# Patient Record
Sex: Female | Born: 1990 | Race: White | Hispanic: No | Marital: Married | Smoking: Former smoker
Health system: Southern US, Community
[De-identification: ages and names within clinical notes are randomized; demographics above are authoritative.]

## PROBLEM LIST (undated history)

## (undated) DIAGNOSIS — F99 Mental disorder, not otherwise specified: Secondary | ICD-10-CM

## (undated) DIAGNOSIS — X789XXA Intentional self-harm by unspecified sharp object, initial encounter: Secondary | ICD-10-CM

## (undated) DIAGNOSIS — T8859XA Other complications of anesthesia, initial encounter: Secondary | ICD-10-CM

## (undated) DIAGNOSIS — T4145XA Adverse effect of unspecified anesthetic, initial encounter: Secondary | ICD-10-CM

## (undated) HISTORY — PX: DILATION AND CURETTAGE OF UTERUS: SHX78

## (undated) HISTORY — PX: ANKLE SURGERY: SHX546

## (undated) HISTORY — PX: WRIST ARTHROSCOPY: SUR100

## (undated) HISTORY — DX: Other complications of anesthesia, initial encounter: T88.59XA

## (undated) HISTORY — DX: Adverse effect of unspecified anesthetic, initial encounter: T41.45XA

## (undated) HISTORY — PX: TONSILLECTOMY: SUR1361

---

## 2011-01-08 ENCOUNTER — Emergency Department (HOSPITAL_COMMUNITY)
Admission: EM | Admit: 2011-01-08 | Discharge: 2011-01-08 | Discharge status: Against Medical Advice | Payer: Self-pay | Attending: Emergency Medicine | Admitting: Emergency Medicine

## 2011-01-08 DIAGNOSIS — Z0389 Encounter for observation for other suspected diseases and conditions ruled out: Secondary | ICD-10-CM | POA: Insufficient documentation

## 2011-04-06 ENCOUNTER — Inpatient Hospital Stay (INDEPENDENT_AMBULATORY_CARE_PROVIDER_SITE_OTHER)
Admission: RE | Admit: 2011-04-06 | Discharge: 2011-04-06 | Disposition: A | Discharge status: Home / Self Care | Payer: Self-pay | Source: Ambulatory Visit | Attending: Emergency Medicine | Admitting: Emergency Medicine

## 2011-04-06 DIAGNOSIS — S335XXA Sprain of ligaments of lumbar spine, initial encounter: Secondary | ICD-10-CM

## 2011-04-18 ENCOUNTER — Emergency Department (HOSPITAL_COMMUNITY)
Admission: EM | Admit: 2011-04-18 | Discharge: 2011-04-19 | Disposition: A | Discharge status: Home / Self Care | Payer: Self-pay | Attending: Emergency Medicine | Admitting: Emergency Medicine

## 2011-04-18 DIAGNOSIS — M79609 Pain in unspecified limb: Secondary | ICD-10-CM | POA: Insufficient documentation

## 2011-04-18 DIAGNOSIS — R0789 Other chest pain: Secondary | ICD-10-CM | POA: Insufficient documentation

## 2011-04-18 DIAGNOSIS — M542 Cervicalgia: Secondary | ICD-10-CM | POA: Insufficient documentation

## 2011-04-18 DIAGNOSIS — J45909 Unspecified asthma, uncomplicated: Secondary | ICD-10-CM | POA: Insufficient documentation

## 2011-04-18 DIAGNOSIS — R110 Nausea: Secondary | ICD-10-CM | POA: Insufficient documentation

## 2011-04-18 DIAGNOSIS — R0602 Shortness of breath: Secondary | ICD-10-CM | POA: Insufficient documentation

## 2011-04-19 ENCOUNTER — Emergency Department (HOSPITAL_COMMUNITY): Payer: Self-pay

## 2011-04-19 LAB — DIFFERENTIAL
Basophils Absolute: 0 10*3/uL (ref 0.0–0.1)
Basophils Relative: 1 % (ref 0–1)
Eosinophils Absolute: 0.2 10*3/uL (ref 0.0–0.7)
Eosinophils Relative: 3 % (ref 0–5)
Monocytes Absolute: 0.5 10*3/uL (ref 0.1–1.0)
Monocytes Relative: 7 % (ref 3–12)
Neutro Abs: 3.3 10*3/uL (ref 1.7–7.7)

## 2011-04-19 LAB — URINALYSIS, ROUTINE W REFLEX MICROSCOPIC
Glucose, UA: NEGATIVE mg/dL
Ketones, ur: NEGATIVE mg/dL
Leukocytes, UA: NEGATIVE
Protein, ur: NEGATIVE mg/dL
Urobilinogen, UA: 1 mg/dL (ref 0.0–1.0)

## 2011-04-19 LAB — POCT I-STAT, CHEM 8
HCT: 42 % (ref 36.0–46.0)
Hemoglobin: 14.3 g/dL (ref 12.0–15.0)
Potassium: 3.8 mEq/L (ref 3.5–5.1)
Sodium: 143 mEq/L (ref 135–145)
TCO2: 24 mmol/L (ref 0–100)

## 2011-04-19 LAB — CBC
MCH: 30.2 pg (ref 26.0–34.0)
MCHC: 34.4 g/dL (ref 30.0–36.0)
Platelets: 265 10*3/uL (ref 150–400)
RDW: 13.2 % (ref 11.5–15.5)

## 2011-07-25 ENCOUNTER — Encounter: Payer: Self-pay | Admitting: *Deleted

## 2011-07-25 ENCOUNTER — Emergency Department (HOSPITAL_COMMUNITY): Payer: Self-pay

## 2011-07-25 ENCOUNTER — Emergency Department (HOSPITAL_COMMUNITY)
Admission: EM | Admit: 2011-07-25 | Discharge: 2011-07-25 | Disposition: A | Discharge status: Home / Self Care | Payer: Self-pay | Attending: Emergency Medicine | Admitting: Emergency Medicine

## 2011-07-25 DIAGNOSIS — B9789 Other viral agents as the cause of diseases classified elsewhere: Secondary | ICD-10-CM | POA: Insufficient documentation

## 2011-07-25 DIAGNOSIS — J3489 Other specified disorders of nose and nasal sinuses: Secondary | ICD-10-CM | POA: Insufficient documentation

## 2011-07-25 DIAGNOSIS — J45909 Unspecified asthma, uncomplicated: Secondary | ICD-10-CM | POA: Insufficient documentation

## 2011-07-25 DIAGNOSIS — H9209 Otalgia, unspecified ear: Secondary | ICD-10-CM | POA: Insufficient documentation

## 2011-07-25 DIAGNOSIS — R059 Cough, unspecified: Secondary | ICD-10-CM | POA: Insufficient documentation

## 2011-07-25 DIAGNOSIS — B349 Viral infection, unspecified: Secondary | ICD-10-CM

## 2011-07-25 DIAGNOSIS — R231 Pallor: Secondary | ICD-10-CM | POA: Insufficient documentation

## 2011-07-25 DIAGNOSIS — R509 Fever, unspecified: Secondary | ICD-10-CM | POA: Insufficient documentation

## 2011-07-25 DIAGNOSIS — R05 Cough: Secondary | ICD-10-CM | POA: Insufficient documentation

## 2011-07-25 MED ORDER — PHENYLEPH-PROMETHAZINE-COD 5-6.25-10 MG/5ML PO SYRP: 5.0000 mL | ORAL_SOLUTION | Freq: Four times a day (QID) | ORAL | Status: DC

## 2011-07-25 MED ORDER — PROMETHAZINE-CODEINE 6.25-10 MG/5ML PO SYRP
5.0000 mL | ORAL_SOLUTION | Freq: Once | ORAL | Status: AC
  Administered 2011-07-25: 5 mL via ORAL
  Filled 2011-07-25: qty 5

## 2011-07-25 MED ORDER — PROMETHAZINE-DM 6.25-15 MG/5ML PO SYRP: 5.0000 mL | ORAL_SOLUTION | Freq: Four times a day (QID) | ORAL | Status: AC | PRN

## 2011-07-25 NOTE — ED Provider Notes (Signed)
History     CSN: [id number] Arrival date & time: 07/25/2011 12:47 AM   First MD Initiated Contact with Patient 07/25/11 0345      Chief Complaint  Patient presents with  . Cough    (Consider location/radiation/quality/duration/timing/severity/associated sxs/prior treatment) Patient is a 20 y.o. female presenting with cough. The history is provided by the patient.  Cough The current episode started more than 2 days ago. The problem occurs constantly. The problem has been gradually worsening. The cough is non-productive. The maximum temperature recorded prior to her arrival was 100 to 100.9 F. Associated symptoms include chills, ear congestion, ear pain and rhinorrhea. Pertinent negatives include no headaches. She has tried cough syrup for the symptoms. Her past medical history is significant for asthma.    Past Medical History  Diagnosis Date  . Asthma     History reviewed. No pertinent past surgical history.  History reviewed. No pertinent family history.  History  Substance Use Topics  . Smoking status: Not on file  . Smokeless tobacco: Not on file  . Alcohol Use: No    OB History    Grav Para Term Preterm Abortions TAB SAB Ect Mult Living                  Review of Systems  Constitutional: Positive for chills.  HENT: Positive for ear pain and rhinorrhea. Negative for ear discharge.   Respiratory: Positive for cough.   Cardiovascular: Negative.   Gastrointestinal: Negative.   Skin: Positive for pallor.  Neurological: Negative for dizziness and headaches.  Hematological: Negative.   Psychiatric/Behavioral: Negative.     Allergies  Review of patient's allergies indicates no known allergies.  Home Medications   Current Outpatient Rx  Name Route Sig Dispense Refill  . AZITHROMYCIN 250 MG PO TABS Oral Take 250 mg by mouth daily. z-pak for 5 days     . HYDROCODONE-HOMATROPINE 5-1.5 MG/5ML PO SYRP Oral Take 5 mLs by mouth every 6 (six) hours as needed. cough         BP 109/73  Pulse 83  Temp(Src) 98.2 F (36.8 C) (Oral)  SpO2 97%  Physical Exam  Constitutional: She is oriented to person, place, and time. She appears well-developed and well-nourished.  HENT:  Head: Normocephalic.  Eyes: EOM are normal.  Neck: Normal range of motion.  Cardiovascular: Normal rate.   Pulmonary/Chest: No respiratory distress. She has no wheezes. She has no rales. She exhibits no tenderness.  Abdominal: Soft.  Musculoskeletal: Normal range of motion.  Neurological: She is oriented to person, place, and time.  Skin: Skin is warm and dry.  Psychiatric: She has a normal mood and affect.    ED Course  Procedures (including critical care time)  Labs Reviewed - No data to display No results found.   No diagnosis found.    MDM  Currently being treated for URI not getting better taking Z pack and Hycodan without relief  Will xray chest and change cough syrup to Phenergan with codiene        Arman Filter, NP 07/25/11 0406  Arman Filter, NP 07/25/11 0408

## 2011-07-25 NOTE — ED Notes (Signed)
Assumed care of pt.  No distress noted.  Pt is noted to be coughing.

## 2011-07-25 NOTE — ED Notes (Signed)
The pt has been coughing since Friday.  She was seen x1 at the [facility] and she was given some cough medicines.  Her lt ear feels stuffed

## 2011-07-25 NOTE — ED Provider Notes (Signed)
Medical screening examination/treatment/procedure(s) were performed by non-physician practitioner and as supervising physician I was immediately available for consultation/collaboration.  Hanley Seamen, MD 07/25/11 [phone]

## 2011-07-25 NOTE — ED Notes (Signed)
Report given to RN Katie.

## 2011-08-06 ENCOUNTER — Emergency Department (HOSPITAL_COMMUNITY)
Admission: EM | Admit: 2011-08-06 | Discharge: 2011-08-06 | Disposition: A | Discharge status: Home / Self Care | Payer: Self-pay | Attending: Emergency Medicine | Admitting: Emergency Medicine

## 2011-08-06 ENCOUNTER — Encounter (HOSPITAL_COMMUNITY): Payer: Self-pay

## 2011-08-06 ENCOUNTER — Emergency Department (HOSPITAL_COMMUNITY): Payer: Self-pay

## 2011-08-06 DIAGNOSIS — R059 Cough, unspecified: Secondary | ICD-10-CM | POA: Insufficient documentation

## 2011-08-06 DIAGNOSIS — J029 Acute pharyngitis, unspecified: Secondary | ICD-10-CM | POA: Insufficient documentation

## 2011-08-06 DIAGNOSIS — R05 Cough: Secondary | ICD-10-CM

## 2011-08-06 DIAGNOSIS — R599 Enlarged lymph nodes, unspecified: Secondary | ICD-10-CM | POA: Insufficient documentation

## 2011-08-06 DIAGNOSIS — R0789 Other chest pain: Secondary | ICD-10-CM | POA: Insufficient documentation

## 2011-08-06 DIAGNOSIS — M549 Dorsalgia, unspecified: Secondary | ICD-10-CM | POA: Insufficient documentation

## 2011-08-06 DIAGNOSIS — B9789 Other viral agents as the cause of diseases classified elsewhere: Secondary | ICD-10-CM

## 2011-08-06 MED ORDER — HYDROCODONE-HOMATROPINE 5-1.5 MG/5ML PO SYRP: 5.0000 mL | ORAL_SOLUTION | Freq: Four times a day (QID) | ORAL | Status: AC | PRN

## 2011-08-06 MED ORDER — DIAZEPAM 5 MG PO TABS: 5.0000 mg | ORAL_TABLET | Freq: Two times a day (BID) | ORAL | Status: AC

## 2011-08-06 NOTE — ED Provider Notes (Signed)
Medical screening examination/treatment/procedure(s) were performed by non-physician practitioner and as supervising physician I was immediately available for consultation/collaboration.   Andrew King, MD 08/06/11 1251 

## 2011-08-06 NOTE — ED Provider Notes (Signed)
History     CSN: [id number] Arrival date & time: 08/06/2011  9:37 AM   First MD Initiated Contact with Patient 08/06/11 1000      Chief Complaint  Patient presents with  . Back Pain    pt in with back and rib pain with sore throat states onset 3 weeks ago with no relief pt states pain when moving states no relief with otc meds  . Sore Throat    (Consider location/radiation/quality/duration/timing/severity/associated sxs/prior treatment) HPI Comments: Patient presents with cough sore throat and rib pain that has been occurring for the last 12 days. She came into the ED on a 10 and was discharged without any antibiotics. She is concerned because her symptoms have not improved and her ribs are hurting from the amount of coughing she's been doing. She has been afebrile and denies rhinorrhea, headache, change in vision, sinus pressure, but other complaints.  Patient is a 20 y.o. female presenting with back pain and pharyngitis. The history is provided by the patient.  Back Pain  Pertinent negatives include no chest pain, no fever, no numbness, no headaches, no abdominal pain, no dysuria and no weakness.  Sore Throat Associated symptoms include coughing. Pertinent negatives include no abdominal pain, chest pain, chills, congestion, fatigue, fever, headaches, myalgias, nausea, numbness, rash, sore throat, vomiting or weakness.    Past Medical History  Diagnosis Date  . Asthma     History reviewed. No pertinent past surgical history.  No family history on file.  History  Substance Use Topics  . Smoking status: Not on file  . Smokeless tobacco: Not on file  . Alcohol Use: No    OB History    Grav Para Term Preterm Abortions TAB SAB Ect Mult Living                  Review of Systems  Constitutional: Negative for fever, chills, appetite change and fatigue.  HENT: Negative for hearing loss, ear pain, nosebleeds, congestion, sore throat, rhinorrhea, sneezing, trouble swallowing,  neck stiffness, voice change, postnasal drip, sinus pressure, tinnitus and ear discharge.   Eyes: Negative for photophobia and visual disturbance.  Respiratory: Positive for cough and chest tightness. Negative for apnea, choking, shortness of breath, wheezing and stridor.   Cardiovascular: Negative for chest pain, palpitations and leg swelling.  Gastrointestinal: Negative for nausea, vomiting, abdominal pain, diarrhea and constipation.  Genitourinary: Negative for dysuria, urgency and flank pain.  Musculoskeletal: Positive for back pain (lower right-sided rib pain). Negative for myalgias.  Skin: Negative for rash.  Neurological: Negative for dizziness, seizures, syncope, weakness, light-headedness, numbness and headaches.  Psychiatric/Behavioral: Negative for behavioral problems and confusion.  All other systems reviewed and are negative.    Allergies  Review of patient's allergies indicates no known allergies.  Home Medications   Current Outpatient Rx  Name Route Sig Dispense Refill  . IBUPROFEN 200 MG PO TABS Oral Take 200 mg by mouth every 6 (six) hours as needed. pain     . HALLS COUGH DROPS MT Mouth/Throat Use as directed 1 lozenge in the mouth or throat as needed. For cough       BP 115/66  Pulse 95  Temp(Src) 98.3 F (36.8 C) (Oral)  Resp 16  SpO2 98%  LMP 07/20/2011  Physical Exam  Nursing note and vitals reviewed. Constitutional: She is oriented to person, place, and time. She appears well-developed and well-nourished. No distress.  HENT:  Head: Normocephalic and atraumatic. No trismus in the jaw.  Right  Ear: Tympanic membrane, external ear and ear canal normal.  Left Ear: Tympanic membrane, external ear and ear canal normal.  Nose: Nose normal.  Mouth/Throat: Uvula is midline and mucous membranes are normal. Oropharyngeal exudate present. No posterior oropharyngeal edema, posterior oropharyngeal erythema or tonsillar abscesses.  Eyes: EOM are normal. Pupils are  equal, round, and reactive to light.       Normal appearance  Neck: Normal range of motion. Neck supple.  Cardiovascular: Normal rate and regular rhythm.   Pulmonary/Chest: Effort normal and breath sounds normal. No respiratory distress. She has no wheezes. She has no rales.   She exhibits tenderness.  Lymphadenopathy:    She has cervical adenopathy.  Neurological: She is alert and oriented to person, place, and time.  Skin: Skin is warm and dry. No rash noted.  Psychiatric: She has a normal mood and affect. Her behavior is normal.    ED Course  Procedures (including critical care time)  Labs Reviewed - No data to display No results found.   No diagnosis found.    MDM  Cough  Sore throat  Chest wall tenderness        [place], [place] 08/06/11 1112

## 2013-01-22 IMAGING — CR DG CHEST 2V
2 series · 2 of 2 positions shown · non-contrast
Comparison: 04/19/2011

CLINICAL DATA: Cough, fever.

CHEST - 2 VIEW

[w chest pa]
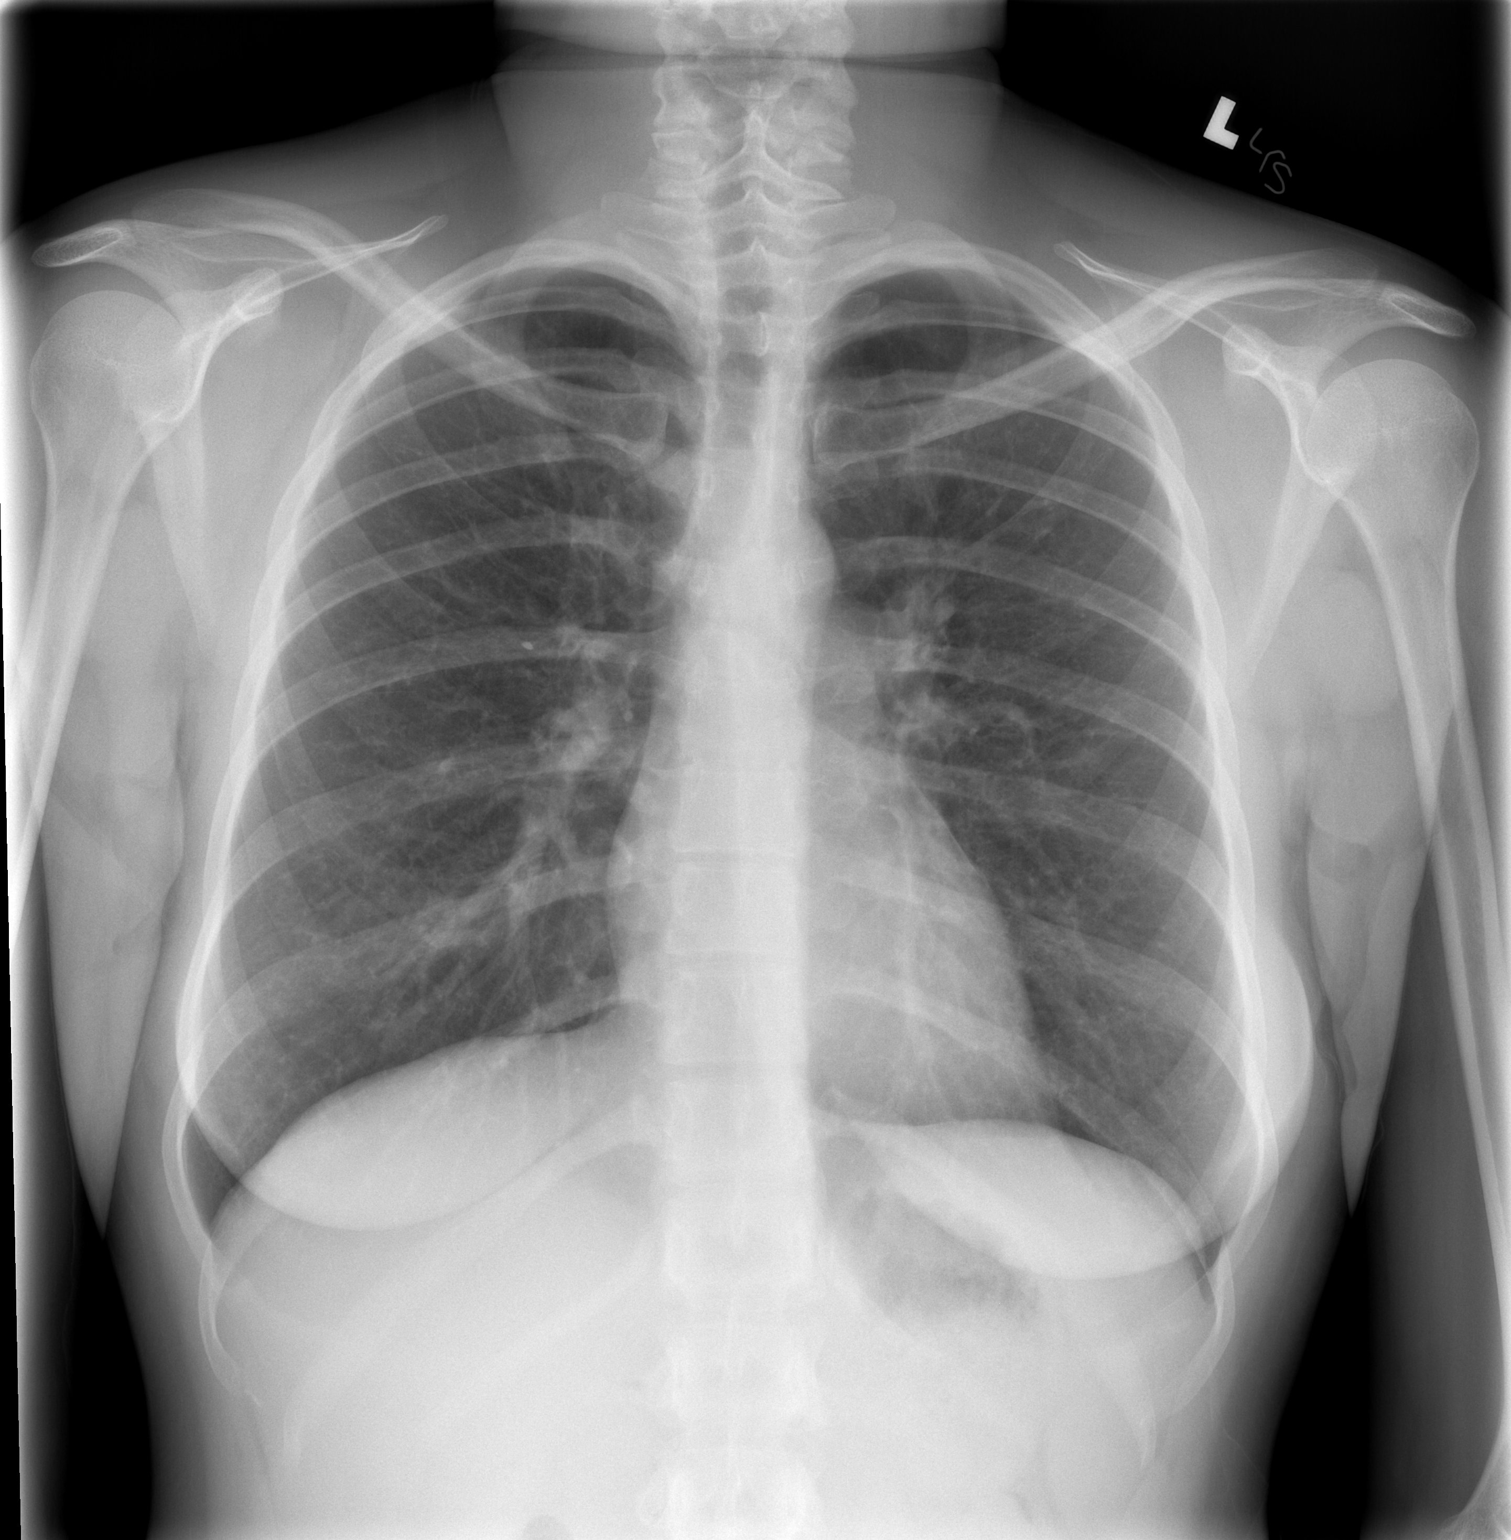

[w chest lat]
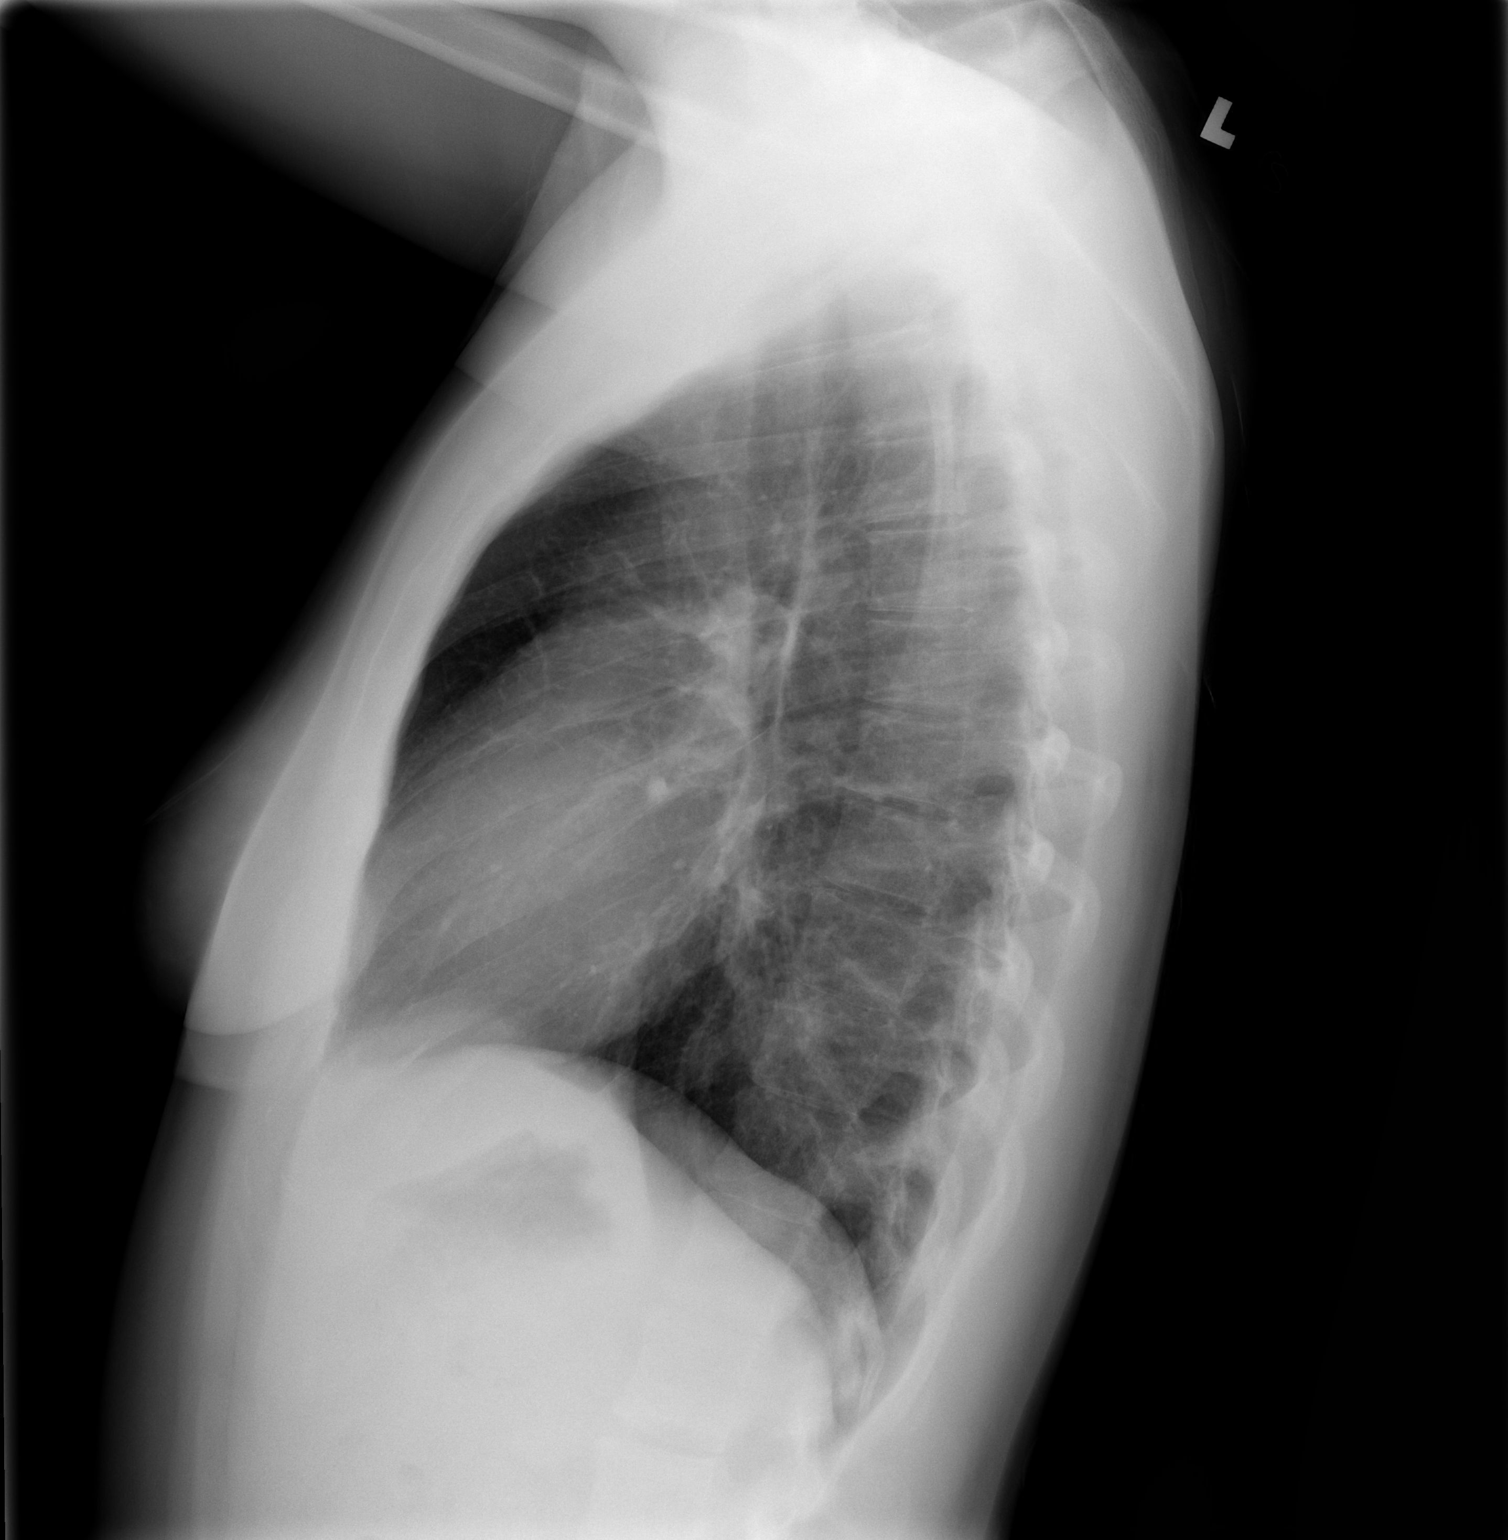

[2 of 2 positions shown; findings below may reference images not displayed]

FINDINGS: Lungs are clear. No pleural effusion or pneumothorax. The
cardiomediastinal contours are within normal limits. The visualized
bones and soft tissues are without significant appreciable
abnormality.
IMPRESSION: No acute cardiopulmonary process identified.

## 2013-02-03 IMAGING — CR DG CHEST 2V
2 series · 2 of 2 positions shown · non-contrast
Comparison: 07/25/2011

CLINICAL DATA: Cough, right sided pain

CHEST - 2 VIEW

[w chest pa]
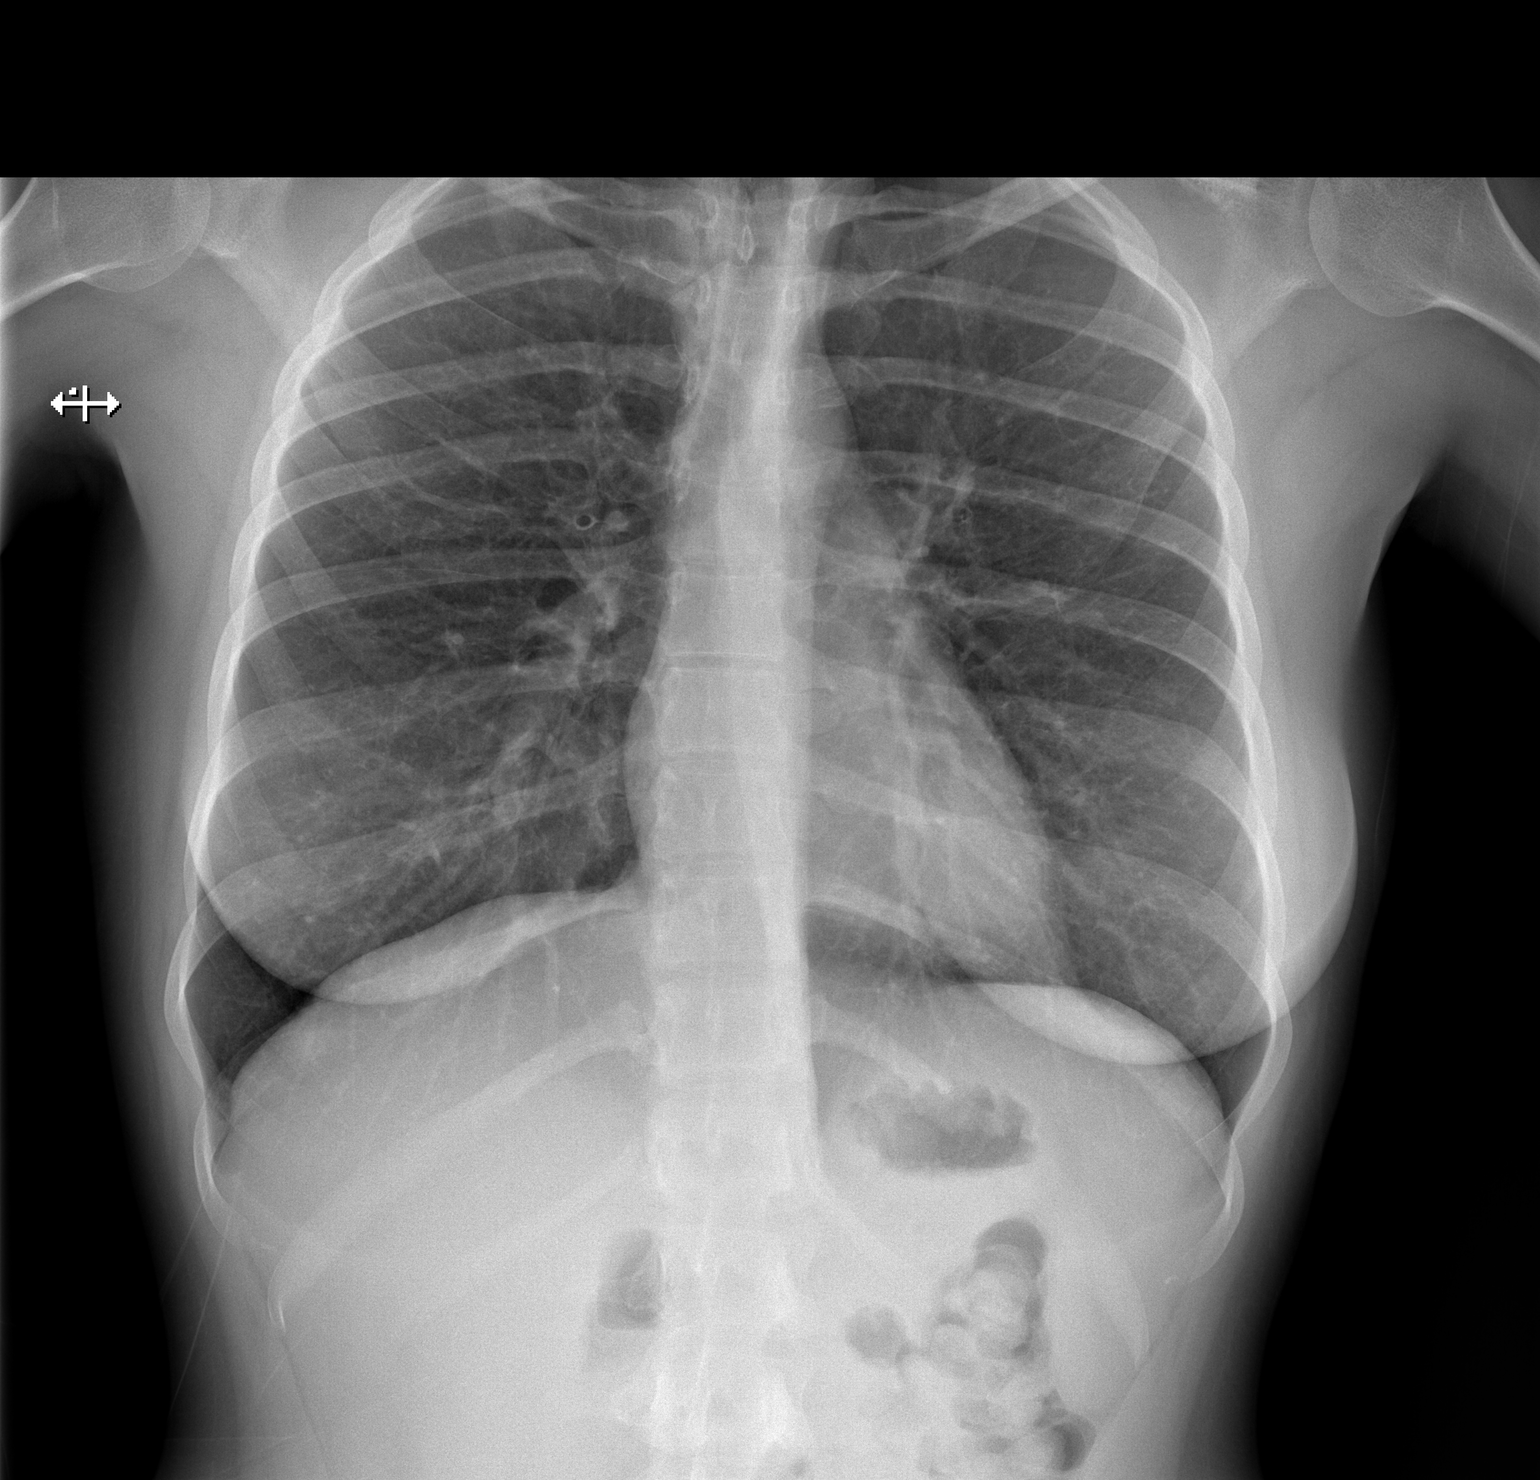

[w chest lat]
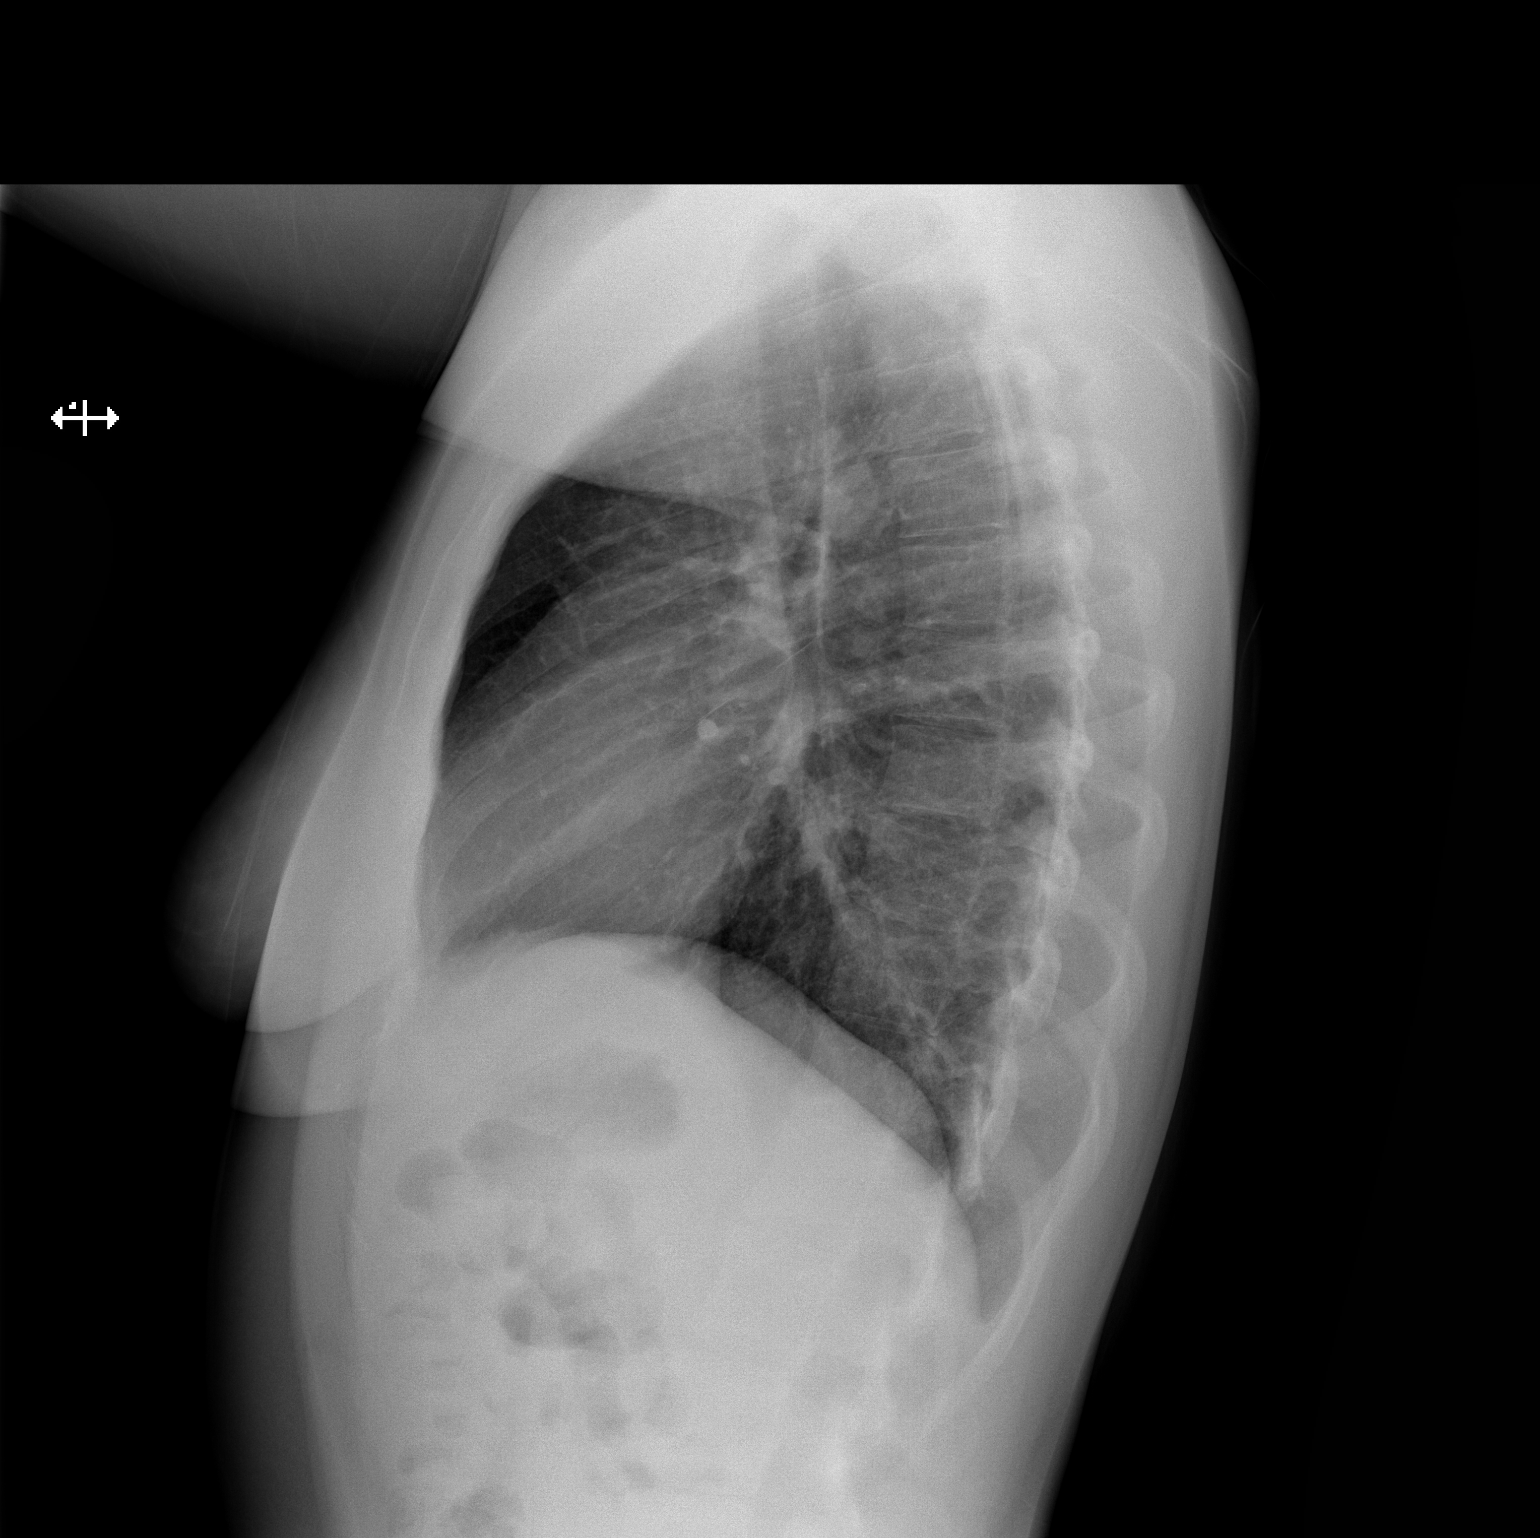

[2 of 2 positions shown; findings below may reference images not displayed]

FINDINGS: No pneumothorax. Lungs clear.  Heart size and pulmonary
vascularity normal.  No effusion.  Visualized bones unremarkable.
IMPRESSION: No acute disease

## 2014-12-13 ENCOUNTER — Encounter (HOSPITAL_COMMUNITY)
Admission: EM | Disposition: A | Discharge status: Other Acute Inpatient Hospital | Payer: Self-pay | Source: Home / Self Care | Attending: Emergency Medicine

## 2014-12-13 ENCOUNTER — Inpatient Hospital Stay (HOSPITAL_COMMUNITY)
Admission: AD | Admit: 2014-12-13 | Discharge: 2014-12-17 | DRG: 885 | Disposition: A | Discharge status: Home / Self Care | Payer: Federal, State, Local not specified - Other | Source: Intra-hospital | Attending: Psychiatry | Admitting: Psychiatry

## 2014-12-13 ENCOUNTER — Ambulatory Visit (HOSPITAL_COMMUNITY)
Admission: EM | Admit: 2014-12-13 | Discharge: 2014-12-13 | Payer: Self-pay | Attending: Emergency Medicine | Admitting: Emergency Medicine

## 2014-12-13 ENCOUNTER — Emergency Department (HOSPITAL_COMMUNITY): Payer: Self-pay | Admitting: Anesthesiology

## 2014-12-13 ENCOUNTER — Encounter (HOSPITAL_COMMUNITY): Payer: Self-pay | Admitting: Behavioral Health

## 2014-12-13 ENCOUNTER — Encounter (HOSPITAL_COMMUNITY): Payer: Self-pay | Admitting: *Deleted

## 2014-12-13 DIAGNOSIS — Z91012 Allergy to eggs: Secondary | ICD-10-CM | POA: Insufficient documentation

## 2014-12-13 DIAGNOSIS — S61512A Laceration without foreign body of left wrist, initial encounter: Secondary | ICD-10-CM

## 2014-12-13 DIAGNOSIS — F17210 Nicotine dependence, cigarettes, uncomplicated: Secondary | ICD-10-CM | POA: Insufficient documentation

## 2014-12-13 DIAGNOSIS — T1491XA Suicide attempt, initial encounter: Secondary | ICD-10-CM

## 2014-12-13 DIAGNOSIS — Z885 Allergy status to narcotic agent status: Secondary | ICD-10-CM | POA: Insufficient documentation

## 2014-12-13 DIAGNOSIS — F321 Major depressive disorder, single episode, moderate: Secondary | ICD-10-CM | POA: Diagnosis not present

## 2014-12-13 DIAGNOSIS — R45851 Suicidal ideations: Secondary | ICD-10-CM | POA: Diagnosis present

## 2014-12-13 DIAGNOSIS — X789XXA Intentional self-harm by unspecified sharp object, initial encounter: Secondary | ICD-10-CM | POA: Diagnosis present

## 2014-12-13 DIAGNOSIS — S66822A Laceration of other specified muscles, fascia and tendons at wrist and hand level, left hand, initial encounter: Secondary | ICD-10-CM | POA: Insufficient documentation

## 2014-12-13 DIAGNOSIS — J45909 Unspecified asthma, uncomplicated: Secondary | ICD-10-CM | POA: Insufficient documentation

## 2014-12-13 DIAGNOSIS — T1491 Suicide attempt: Secondary | ICD-10-CM | POA: Diagnosis not present

## 2014-12-13 DIAGNOSIS — F329 Major depressive disorder, single episode, unspecified: Secondary | ICD-10-CM | POA: Diagnosis not present

## 2014-12-13 DIAGNOSIS — Y929 Unspecified place or not applicable: Secondary | ICD-10-CM | POA: Insufficient documentation

## 2014-12-13 DIAGNOSIS — X781XXA Intentional self-harm by knife, initial encounter: Secondary | ICD-10-CM | POA: Insufficient documentation

## 2014-12-13 HISTORY — DX: Intentional self-harm by unspecified sharp object, initial encounter: X78.9XXA

## 2014-12-13 HISTORY — PX: INCISION AND DRAINAGE OF WOUND: SHX1803

## 2014-12-13 LAB — ETHANOL

## 2014-12-13 LAB — COMPREHENSIVE METABOLIC PANEL
ALBUMIN: 4.7 g/dL (ref 3.5–5.2)
ALK PHOS: 49 U/L (ref 39–117)
ALT: 14 U/L (ref 0–35)
ANION GAP: 12 (ref 5–15)
AST: 20 U/L (ref 0–37)
BUN: 9 mg/dL (ref 6–23)
CO2: 23 mmol/L (ref 19–32)
CREATININE: 0.77 mg/dL (ref 0.50–1.10)
Calcium: 9.2 mg/dL (ref 8.4–10.5)
Chloride: 106 mmol/L (ref 96–112)
GFR calc Af Amer: >90 mL/min (ref >90)
GFR calc non Af Amer: >90 mL/min (ref >90)
GLUCOSE: 113 mg/dL — AB (ref 70–99)
Potassium: 3 mmol/L — ABNORMAL LOW (ref 3.5–5.1)
SODIUM: 141 mmol/L (ref 135–145)
Total Bilirubin: 0.6 mg/dL (ref 0.3–1.2)
Total Protein: 7.5 g/dL (ref 6.0–8.3)

## 2014-12-13 LAB — RAPID URINE DRUG SCREEN, HOSP PERFORMED
AMPHETAMINES: NOT DETECTED
Barbiturates: NOT DETECTED
Benzodiazepines: NOT DETECTED
COCAINE: NOT DETECTED
OPIATES: POSITIVE — AB
TETRAHYDROCANNABINOL: POSITIVE — AB

## 2014-12-13 LAB — CBC
HEMATOCRIT: 44.9 % (ref 36.0–46.0)
Hemoglobin: 15.1 g/dL — ABNORMAL HIGH (ref 12.0–15.0)
MCH: 31.3 pg (ref 26.0–34.0)
MCHC: 33.6 g/dL (ref 30.0–36.0)
MCV: 93 fL (ref 78.0–100.0)
Platelets: 223 10*3/uL (ref 150–400)
RBC: 4.83 MIL/uL (ref 3.87–5.11)
RDW: 14 % (ref 11.5–15.5)
WBC: 6.8 10*3/uL (ref 4.0–10.5)

## 2014-12-13 LAB — SALICYLATE LEVEL

## 2014-12-13 LAB — POC URINE PREG, ED: PREG TEST UR: NEGATIVE

## 2014-12-13 LAB — ACETAMINOPHEN LEVEL: Acetaminophen (Tylenol), Serum: <10 ug/mL — ABNORMAL LOW (ref 10–30)

## 2014-12-13 SURGERY — IRRIGATION AND DEBRIDEMENT WOUND
Anesthesia: General | Site: Wrist | Laterality: Left

## 2014-12-13 MED ORDER — OXYCODONE-ACETAMINOPHEN 5-325 MG PO TABS
2.0000 | ORAL_TABLET | Freq: Once | ORAL | Status: AC
  Administered 2014-12-13: 2 via ORAL

## 2014-12-13 MED ORDER — ACETAMINOPHEN 325 MG PO TABS
650.0000 mg | ORAL_TABLET | Freq: Four times a day (QID) | ORAL | Status: DC | PRN
  Administered 2014-12-17: 650 mg via ORAL
  Filled 2014-12-13: qty 2
  Filled 2014-12-13: qty 1

## 2014-12-13 MED ORDER — FENTANYL CITRATE 0.05 MG/ML IJ SOLN
INTRAMUSCULAR | Status: DC | PRN
  Administered 2014-12-13 (×2): 100 ug via INTRAVENOUS
  Administered 2014-12-13: 50 ug via INTRAVENOUS

## 2014-12-13 MED ORDER — DIPHENHYDRAMINE HCL 50 MG/ML IJ SOLN
INTRAMUSCULAR | Status: AC
  Filled 2014-12-13: qty 1

## 2014-12-13 MED ORDER — NICOTINE 7 MG/24HR TD PT24
7.0000 mg | MEDICATED_PATCH | Freq: Every day | TRANSDERMAL | Status: DC
  Administered 2014-12-13: 7 mg via TRANSDERMAL
  Filled 2014-12-13: qty 1

## 2014-12-13 MED ORDER — ONDANSETRON HCL 4 MG/2ML IJ SOLN
INTRAMUSCULAR | Status: DC | PRN
  Administered 2014-12-13: 4 mg via INTRAVENOUS

## 2014-12-13 MED ORDER — MIDAZOLAM HCL 5 MG/5ML IJ SOLN
INTRAMUSCULAR | Status: DC | PRN
  Administered 2014-12-13: 2 mg via INTRAVENOUS

## 2014-12-13 MED ORDER — BUPIVACAINE HCL (PF) 0.5 % IJ SOLN
INTRAMUSCULAR | Status: DC | PRN
  Administered 2014-12-13: 10 mL

## 2014-12-13 MED ORDER — OXYCODONE-ACETAMINOPHEN 5-325 MG PO TABS
2.0000 | ORAL_TABLET | Freq: Four times a day (QID) | ORAL | Status: DC | PRN
  Administered 2014-12-14 – 2014-12-16 (×10): 2 via ORAL
  Filled 2014-12-13 (×10): qty 2

## 2014-12-13 MED ORDER — HYDROMORPHONE HCL 1 MG/ML IJ SOLN: 0.2500 mg | INTRAMUSCULAR | Status: DC | PRN

## 2014-12-13 MED ORDER — MIDAZOLAM HCL 2 MG/2ML IJ SOLN
INTRAMUSCULAR | Status: AC
  Filled 2014-12-13: qty 2

## 2014-12-13 MED ORDER — LIDOCAINE HCL (CARDIAC) 20 MG/ML IV SOLN
INTRAVENOUS | Status: DC | PRN
  Administered 2014-12-13: 100 mg via INTRAVENOUS

## 2014-12-13 MED ORDER — ONDANSETRON HCL 4 MG/2ML IJ SOLN
INTRAMUSCULAR | Status: AC
  Filled 2014-12-13: qty 2

## 2014-12-13 MED ORDER — HYDROXYZINE HCL 25 MG PO TABS: 25.0000 mg | ORAL_TABLET | Freq: Four times a day (QID) | ORAL | Status: DC | PRN

## 2014-12-13 MED ORDER — OXYCODONE-ACETAMINOPHEN 5-325 MG PO TABS
ORAL_TABLET | ORAL | Status: AC
  Administered 2014-12-13: 2 via ORAL
  Filled 2014-12-13: qty 2

## 2014-12-13 MED ORDER — MAGNESIUM HYDROXIDE 400 MG/5ML PO SUSP: 30.0000 mL | Freq: Every day | ORAL | Status: DC | PRN

## 2014-12-13 MED ORDER — PROPOFOL 10 MG/ML IV BOLUS
INTRAVENOUS | Status: DC | PRN
  Administered 2014-12-13: 50 mg via INTRAVENOUS
  Administered 2014-12-13: 150 mg via INTRAVENOUS

## 2014-12-13 MED ORDER — PROPOFOL 10 MG/ML IV BOLUS
INTRAVENOUS | Status: AC
  Filled 2014-12-13: qty 20

## 2014-12-13 MED ORDER — 0.9 % SODIUM CHLORIDE (POUR BTL) OPTIME
TOPICAL | Status: DC | PRN
  Administered 2014-12-13: 1000 mL

## 2014-12-13 MED ORDER — MEPERIDINE HCL 25 MG/ML IJ SOLN: 6.2500 mg | INTRAMUSCULAR | Status: DC | PRN

## 2014-12-13 MED ORDER — TETANUS-DIPHTH-ACELL PERTUSSIS 5-2.5-18.5 LF-MCG/0.5 IM SUSP
0.5000 mL | Freq: Once | INTRAMUSCULAR | Status: AC
  Administered 2014-12-13: 0.5 mL via INTRAMUSCULAR
  Filled 2014-12-13: qty 0.5

## 2014-12-13 MED ORDER — TRAZODONE HCL 50 MG PO TABS
50.0000 mg | ORAL_TABLET | Freq: Every evening | ORAL | Status: DC | PRN
  Filled 2014-12-13: qty 14

## 2014-12-13 MED ORDER — FENTANYL CITRATE 0.05 MG/ML IJ SOLN
INTRAMUSCULAR | Status: AC
  Filled 2014-12-13: qty 5

## 2014-12-13 MED ORDER — ALUM & MAG HYDROXIDE-SIMETH 200-200-20 MG/5ML PO SUSP: 30.0000 mL | ORAL | Status: DC | PRN

## 2014-12-13 MED ORDER — CEFAZOLIN SODIUM-DEXTROSE 2-3 GM-% IV SOLR
INTRAVENOUS | Status: DC | PRN
Start: 2014-12-13 — End: 2014-12-13
  Administered 2014-12-13: 2 g via INTRAVENOUS

## 2014-12-13 MED ORDER — LACTATED RINGERS IV SOLN
INTRAVENOUS | Status: DC | PRN
  Administered 2014-12-13: 15:00:00 via INTRAVENOUS

## 2014-12-13 MED ORDER — HYDROXYZINE HCL 50 MG PO TABS: 50.0000 mg | ORAL_TABLET | Freq: Once | ORAL | Status: DC

## 2014-12-13 MED ORDER — LIDOCAINE HCL (CARDIAC) 20 MG/ML IV SOLN
INTRAVENOUS | Status: AC
  Filled 2014-12-13: qty 5

## 2014-12-13 MED ORDER — DIPHENHYDRAMINE HCL 50 MG/ML IJ SOLN
INTRAMUSCULAR | Status: DC | PRN
  Administered 2014-12-13: 25 mg via INTRAVENOUS

## 2014-12-13 MED ORDER — ONDANSETRON HCL 4 MG/2ML IJ SOLN: 4.0000 mg | Freq: Once | INTRAMUSCULAR | Status: DC | PRN

## 2014-12-13 SURGICAL SUPPLY — 14 items
BANDAGE ELASTIC 3 VELCRO ST LF (GAUZE/BANDAGES/DRESSINGS) ×3 IMPLANT
BANDAGE ELASTIC 4 VELCRO ST LF (GAUZE/BANDAGES/DRESSINGS) ×3 IMPLANT
BANDAGE GAUZE 4  KLING STR (GAUZE/BANDAGES/DRESSINGS) ×6 IMPLANT
NEEDLE HYPO 25GX1X1/2 BEV (NEEDLE) ×3 IMPLANT
PAD CAST 3X4 CTTN HI CHSV (CAST SUPPLIES) ×1 IMPLANT
PAD CAST 4YDX4 CTTN HI CHSV (CAST SUPPLIES) ×1 IMPLANT
PADDING CAST COTTON 3X4 STRL (CAST SUPPLIES) ×2
PADDING CAST COTTON 4X4 STRL (CAST SUPPLIES) ×2
SPLINT FIBERGLASS 3X12 (CAST SUPPLIES) ×3 IMPLANT
SPONGE GAUZE 4X4 12PLY STER LF (GAUZE/BANDAGES/DRESSINGS) ×3 IMPLANT
SUT FIBER WIRE 4.0 (SUTURE) ×3 IMPLANT
SUT MNCRL AB 4-0 PS2 18 (SUTURE) ×3 IMPLANT
SUT PROLENE 5 0 RB 1 DA (SUTURE) ×9 IMPLANT
SYR CONTROL 10ML LL (SYRINGE) ×3 IMPLANT

## 2014-12-13 NOTE — Tx Team (Signed)
Initial Interdisciplinary Treatment Plan   PATIENT STRESSORS: Loss of grandmother 12/07/2014 Marital or family conflict Traumatic event   PATIENT STRENGTHS: Ability for insight MetallurgistCommunication skills Financial means General fund of knowledge Physical Health Special hobby/interest Supportive family/friends   PROBLEM LIST: Problem List/Patient Goals Date to be addressed Date deferred Reason deferred Estimated date of resolution  Suicide Risk "I wasn't trying to kill my self I was trying to created physical pain to deal with the emotional pain" 12/13/2014     Depression 12/13/2014     "my grandma died last tuesday 12/13/2014      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  The patient and family have been given the opportunity to ask questions and make suggestions.  Angeline SlimHill, Myah Guynes M 12/13/2014, 11:58 PM

## 2014-12-13 NOTE — Progress Notes (Signed)
Dr. .Izora Ribasoley at bedside. [facility] Dr. Gloris ManchesterAkintayo's note. Called [facility] who [facility]. Paged house coverage; did not receive a call back. Called [facility] and spoke to SebreeKristin who said that pt did have a room there (402 bed 1) but could not arrive until 7p. Belenda CruiseKristin stated that [facility] is who they use to get the pt to them. Called [facility] at [phone] and arranged for [facility] and [facility] at [facility].

## 2014-12-13 NOTE — Anesthesia Postprocedure Evaluation (Signed)
Anesthesia Post Note  Patient: Kayla Yates  Procedure(s) Performed: Procedure(s) (LRB): IRRIGATION AND DEBRIDEMENT  WRIST, EXPLORATION OF MEDIAL NERVE AND RADIAL ARTERY, REPAIR OF FCR, PL, & FCU. (Left)  Anesthesia type: general  Patient location: PACU  Post pain: Pain level controlled  Post assessment: Patient's Cardiovascular Status Stable  Last Vitals:  Filed Vitals:   12/13/14 1737  BP: 119/72  Pulse: 81  Temp:   Resp: 8    Post vital signs: Reviewed and stable  Level of consciousness: sedated  Complications: No apparent anesthesia complications

## 2014-12-13 NOTE — ED Notes (Signed)
TTS at bedside. 

## 2014-12-13 NOTE — Progress Notes (Signed)
Brought pt's fiance Thayer OhmChris to bedside. Pt got upset that she is not being told anything and that she has not been evaluated by anyone and that she did not try to kill herself. [facility] and states that no one has evaluated her or told her what is going on or what the plan is. Explained that the [facility]. Pt crying.

## 2014-12-13 NOTE — Progress Notes (Signed)
Verified with patient that she is allergic to codeine in cough medicine and able to take the percocet she was given in PACU after repair of left wrist.

## 2014-12-13 NOTE — Progress Notes (Signed)
I attempted to call report to Careers adviserCaroline RN at [facility]. Pt does not have IVC papers and has not signed voluntary paperwork. I gave report to Rayfield CitizenCaroline then was transferred to Maury DusEric Caplan who informed me that the pt could not come there until some sort of paperwork was done. I have nothing and cannot see all the notes in the computer from the social worker or any admitting MD. Minerva AreolaEric faxed me voluntary paperwork. [facility] if [facility] her if she would not sign the paperwork. Spoke to General MillsMelissa - Consulting civil engineercharge RN who said that this should have been done prior and that we would need to find an MD to come to PACU. She was calling house coverage to get clarification. I spoke with Luther Parodyaitlin and she said she didn't feel like she had a choice in the matter - either she willingly goes or we have papers filled out to make her go - not really a choice because either way she is going. She said that she would sign the papers and let [facility].   I had also called [facility] to transport the pt but they said that I needed to call [facility] to transport her. [facility] said to [facility] to [facility] to them. I [facility] and arranged for them to pick up pt at 7p.  Pt [facility]
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
## 2014-12-13 NOTE — ED Notes (Signed)
Pt arrived with laceration across left wrist approx 2 in across wrist with tendons visible. Bleeding controlled with pressure, intentional due to "my grandmother died Tuesday" 1st attempt in trying to hurt self.

## 2014-12-13 NOTE — ED Notes (Signed)
She[place], RN, [facility] notified need for sitter

## 2014-12-13 NOTE — Anesthesia Procedure Notes (Signed)
Procedure Name: LMA Insertion Date/Time: 12/13/2014 3:32 PM Performed by: Orlinda BlalockMCMILLEN, Rehmat Murtagh L Pre-anesthesia Checklist: Patient identified, Emergency Drugs available, Suction available, Patient being monitored and Timeout performed Patient Re-evaluated:Patient Re-evaluated prior to inductionOxygen Delivery Method: Circle system utilized Preoxygenation: Pre-oxygenation with 100% oxygen Intubation Type: IV induction Ventilation: Mask ventilation without difficulty LMA: LMA inserted LMA Size: 4.0 Number of attempts: 1 Placement Confirmation: positive ETCO2 and breath sounds checked- equal and bilateral Tube secured with: Tape Dental Injury: Teeth and Oropharynx as per pre-operative assessment

## 2014-12-13 NOTE — Discharge Instructions (Signed)
Discharge Instructions:  Keep your dressing clean, dry and in place until instructed to remove by Dr. Izora Ribasoley.  If the dressing becomes dirty or wet call the office for instructions during business hours. Elevate the extremity to help with swelling, this will also help with any discomfort.  No lifting with the injured  Extremity. Do ot remove splint, Do not try to bend wrist, finger motion is acceptable.  If you feel that the dressing is too tight, you may loosen it, but keep it on; finger tips should be pink; if there is a concern, call the office. [phone] Ice may be used if the injury is a fracture, do not apply ice directly to the skin. Please call the office on the next business day after discharge to arrange a follow up appointment.  Call [phone] between the hours of 9am - 5pm M-Th or 9am - 1pm on Fri. For most hand injuries and/or conditions, you may return to work using the uninjured hand (one handed duty) within 24-72 hours.  A detailed note will be provided to you at your follow up appointment or may contact the office prior to your follow up.

## 2014-12-13 NOTE — ED Notes (Signed)
House coverage aware of pt. ED will cover 1:1 sitter case at this time.

## 2014-12-13 NOTE — Op Note (Signed)
NAMEllwood Handler:  Vollmer, Kassidie               ACCOUNT NO.:  [id number]  MEDICAL RECORD NO.:  [id number]  LOCATION:  [facility]                         FACILITY:  [facility]  PHYSICIAN:  Johnette AbrahamHarrill C Rolfe Hartsell, MD    DATE OF BIRTH:  1991-05-31  DATE OF PROCEDURE:  12/13/2014 DATE OF DISCHARGE:  12/13/2014                              OPERATIVE REPORT   PREOPERATIVE DIAGNOSIS:  Laceration of the left wrist with tendon involvement.  POSTOPERATIVE DIAGNOSIS:  Laceration of the left wrist with tendon involvement.  PROCEDURES: 1. Exploration of wound to the left wrist, exploration strictly of the     radial artery, median nerve, ulnar nerve, flexor tendon structures. 2. Repair of the FCR. 3. Repair of palmaris longus. 4. Repair of flexor carpi ulnaris.  ANESTHESIA:  General.  ESTIMATED BLOOD LOSS:  Minimal.  TOURNIQUET TIME:  About an hour.  COMPLICATIONS:  No acute complications.  INDICATIONS:  Ms. Kayla Yates is a 24 year old female, presented to the emergency room with self-inflicted laceration to left wrist.  On evaluation, she had obvious tendon lacerations.  She had gross sensation in her fingers and was able to flex each individual digit independently. Risks, benefits, and alternatives of wound exploration and tendon and possible nerve repair were held with the patient and the patient's significant other.  Consent was obtained for surgery.  PROCEDURE IN DETAIL:  The patient was taken to the operating room, placed supine on the operating table.  Time-out was performed.  General anesthesia was administered without difficulty.  The left upper extremity was prepped and draped in normal sterile fashion.  Tourniquet was used.  The arm was exsanguinated and tourniquet was inflated to 250 mmHg.  The wound was explored.  The wound was irrigated with some cysto tubing for approximately 1.5 L of saline.  Next, the wound was explored. There was obvious FCR, FCU, and palmaris longus tendon injury.  The deep fascia  was also entered.  The radial artery and median nerve were isolated without laceration.  The ulnar side of the wrist the ulnar artery and ulnar nerve were also visualized without injury.  The deep structures and the deep flexor compartment, none of these tendons were lacerated.  After thorough irrigation, each of the above-mentioned tendons were isolated and repair was made, but the FCR and FCU tendons were repaired with a double 4-stranded modified Kessler stitch following by a running epitendinous 5-0 Prolene.  The palmaris longus tendon was repaired with a single 2-stranded modified Kessler suture of 4-0 FiberWire.  Next, the wound was closed in a subcuticular fashion.  A sterile dressing and posterior splint were placed with the wrist in slight flexion.  The patient tolerated the procedure well and was taken to recovery room stable.     Johnette AbrahamHarrill C Jedi Catalfamo, MD     HCC/MEDQ  D:  12/13/2014  T:  12/13/2014  Job:  [id number]
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
## 2014-12-13 NOTE — Transfer of Care (Signed)
Immediate Anesthesia Transfer of Care Note  Patient: Kayla Yates  Procedure(s) Performed: Procedure(s): IRRIGATION AND DEBRIDEMENT  WRIST, EXPLORATION OF MEDIAL NERVE AND RADIAL ARTERY, REPAIR OF FCR, PL, & FCU. (Left)  Patient Location: PACU  Anesthesia Type:General  Level of Consciousness: awake, alert , oriented and patient cooperative  Airway & Oxygen Therapy: Patient Spontanous Breathing  Post-op Assessment: Report given to RN, Post -op Vital signs reviewed and stable and Patient moving all extremities  Post vital signs: Reviewed and stable  Last Vitals:  Filed Vitals:   12/13/14 1635  BP:   Pulse:   Temp: 36.7 C  Resp:     Complications: No apparent anesthesia complications

## 2014-12-13 NOTE — Anesthesia Preprocedure Evaluation (Signed)
Anesthesia Evaluation  Patient identified by MRN, date of birth, ID band Patient awake    Reviewed: Allergy & Precautions, NPO status , Patient's Chart, lab work & pertinent test results  Airway Mallampati: I  TM Distance: >3 FB Neck ROM: Full    Dental   Pulmonary asthma , Current Smoker,          Cardiovascular     Neuro/Psych    GI/Hepatic   Endo/Other    Renal/GU      Musculoskeletal   Abdominal   Peds  Hematology   Anesthesia Other Findings   Reproductive/Obstetrics                             Anesthesia Physical Anesthesia Plan  ASA: II  Anesthesia Plan: General   Post-op Pain Management:    Induction: Intravenous  Airway Management Planned: Oral ETT  Additional Equipment:   Intra-op Plan:   Post-operative Plan: Extubation in OR  Informed Consent: I have reviewed the patients History and Physical, chart, labs and discussed the procedure including the risks, benefits and alternatives for the proposed anesthesia with the patient or authorized representative who has indicated his/her understanding and acceptance.     Plan Discussed with: CRNA and Surgeon  Anesthesia Plan Comments:         Anesthesia Quick Evaluation

## 2014-12-13 NOTE — Progress Notes (Signed)
Pt is s/p laceration and tendon repair L wrist.  She [facility]  Pt to keep splint in place, NWB of L wrist.  Pt may move fingers.  Elevate hand to help with swelling.  I recommend a short course (3-5 days) of antibiotics (Keflex) to prevent a wound infection.  Admitting MD may also give pian Rx for post surgical discomfort.  Please have pt f/u in my office or someone notify me in 2 wks for additional instructions.

## 2014-12-13 NOTE — ED Provider Notes (Signed)
CSN: [id number]     Arrival date & time 12/13/14  1002 History   First MD Initiated Contact with Patient 12/13/14 1017     Chief Complaint  Patient presents with  . Suicide Attempt     (Consider location/radiation/quality/duration/timing/severity/associated sxs/prior Treatment) Patient is a 24 y.o. female presenting with arm injury and mental health disorder.  Arm Injury Location:  Wrist Time since incident:  20 minutes Injury: yes   Mechanism of injury comment:  Self inflicted laceration Wrist location:  L wrist Pain details:    Quality:  Sharp   Severity:  Moderate   Onset quality:  Sudden   Timing:  Constant   Progression:  Unchanged Handedness:  Left-handed Relieved by:  Nothing Worsened by:  Movement Associated symptoms: decreased range of motion (pain with ROM) and muscle weakness   Associated symptoms: no numbness   Mental Health Problem Presenting symptoms: self mutilation   Presenting symptoms comment:  "I'm not sure what I was trying to do" Patient accompanied by: fiance. Onset quality:  Sudden Context: drug abuse (took an oxycodone last night. ) and stressful life event (grandmother recently passed away)     Past Medical History  Diagnosis Date  . Asthma   . Suicide and self-inflicted injury by cutting and piercing instrument    Past Surgical History  Procedure Laterality Date  . Tonsillectomy    . Ankle surgery     No family history on file. History  Substance Use Topics  . Smoking status: Current Every Day Smoker    Types: Cigarettes  . Smokeless tobacco: Not on file  . Alcohol Use: Yes     Comment: soc   OB History    No data available     Review of Systems  Psychiatric/Behavioral: Positive for self-injury.  All other systems reviewed and are negative.     Allergies  Codeine and Eggs or egg-derived products  Home Medications   Prior to Admission medications   Medication Sig Start Date End Date Taking? Authorizing Provider    ibuprofen (ADVIL,MOTRIN) 200 MG tablet Take 200 mg by mouth every 6 (six) hours as needed. pain    Yes Historical Provider, MD   BP 118/63 mmHg  Pulse 114  Temp(Src) 97.6 F (36.4 C) (Oral)  Resp 20  SpO2 100%  LMP 12/06/2014 Physical Exam  Constitutional: She is oriented to person, place, and time. She appears well-developed and well-nourished. No distress.  HENT:  Head: Normocephalic and atraumatic.  Eyes: Conjunctivae are normal. No scleral icterus.  Neck: Neck supple.  Cardiovascular: Normal rate and intact distal pulses.   Pulmonary/Chest: Effort normal. No stridor. No respiratory distress.  Abdominal: Normal appearance. She exhibits no distension.  Musculoskeletal:  Left wrist with large laceration with exposed and compromised tendon.  See attached pictures.  Sensation intact.  Finger ROM intact.  Wrist flexion decreased. Radial and ulnar pulses palpable distal to injury.   Neurological: She is alert and oriented to person, place, and time.  Skin: Skin is warm and dry. No rash noted.  Psychiatric: Her mood appears anxious. She is withdrawn.  Nursing note and vitals reviewed.          ED Course  Procedures (including critical care time) Labs Review Labs Reviewed  ACETAMINOPHEN LEVEL - Abnormal; Notable for the following:    Acetaminophen (Tylenol), Serum <10.0 (*)    All other components within normal limits  CBC - Abnormal; Notable for the following:    Hemoglobin 15.1 (*)  All other components within normal limits  COMPREHENSIVE METABOLIC PANEL - Abnormal; Notable for the following:    Potassium 3.0 (*)    Glucose, Bld 113 (*)    All other components within normal limits  URINE RAPID DRUG SCREEN (HOSP PERFORMED) - Abnormal; Notable for the following:    Opiates POSITIVE (*)    Tetrahydrocannabinol POSITIVE (*)    All other components within normal limits  ETHANOL  SALICYLATE LEVEL  POC URINE PREG, ED    Imaging Review No results found.   EKG  Interpretation None      MDM   Final diagnoses:  Suicide attempt  Wrist laceration, left, initial encounter    24 yo female who presents after slicing her left wrist with a pocket knife.  She has sustained damage to several tendons in her wrist, but appears neurovascularly intact. She states she cut her wrist because she was sad that her grandmother died.  Regarding her wrist injury, she will need to be taken to the OR by Dr. Izora Ribasoley. [facility], so we'll transfer her from here to the emergency department there.  She is cooperative and voluntary for continued psychiatric evaluation, which she will need after her operation.      Blake DivineJohn Sie Formisano, MD 12/13/14 1236
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
## 2014-12-13 NOTE — Consult Note (Signed)
Reason for Consult:laceration Referring Physician: ER  CC:I cut my wrist  HPI:  Kayla Yates is an 24 y.o. right handed female who presents with  Self inflicted laceration to L wrist      .   Pain is rated at  8  /10 and is described as sharp.  Pain is constant.  Pain is made better by rest/immobilization, worse with motion.   Associated signs/symptoms:self inflicted, denies numbness to fingers, difficulty moving wrist (flexing) Previous treatment:    Past Medical History  Diagnosis Date  . Asthma   . Suicide and self-inflicted injury by cutting and piercing instrument     Past Surgical History  Procedure Laterality Date  . Tonsillectomy    . Ankle surgery      History reviewed. No pertinent family history.  Social History:  reports that she has been smoking Cigarettes.  She does not have any smokeless tobacco history on file. She reports that she drinks alcohol. She reports that she does not use illicit drugs.  Allergies:  Allergies  Allergen Reactions  . Codeine Itching  . Eggs Or Egg-Derived Products     Medications: I have reviewed the patient's current medications.  Results for orders placed or performed during the hospital encounter of 12/13/14 (from the past 48 hour(s))  Acetaminophen level     Status: Abnormal   Collection Time: 12/13/14 10:23 AM  Result Value Ref Range   Acetaminophen (Tylenol), Serum <10.0 (L) 10 - 30 ug/mL    Comment:        THERAPEUTIC CONCENTRATIONS VARY SIGNIFICANTLY. A RANGE OF 10-30 ug/mL MAY BE AN EFFECTIVE CONCENTRATION FOR MANY PATIENTS. HOWEVER, SOME ARE BEST TREATED AT CONCENTRATIONS OUTSIDE THIS RANGE. ACETAMINOPHEN CONCENTRATIONS >150 ug/mL AT 4 HOURS AFTER INGESTION AND >50 ug/mL AT 12 HOURS AFTER INGESTION ARE OFTEN ASSOCIATED WITH TOXIC REACTIONS.   CBC     Status: Abnormal   Collection Time: 12/13/14 10:23 AM  Result Value Ref Range   WBC 6.8 4.0 - 10.5 K/uL   RBC 4.83 3.87 - 5.11 MIL/uL   Hemoglobin 15.1 (H) 12.0  - 15.0 g/dL   HCT 44.9 36.0 - 46.0 %   MCV 93.0 78.0 - 100.0 fL   MCH 31.3 26.0 - 34.0 pg   MCHC 33.6 30.0 - 36.0 g/dL   RDW 14.0 11.5 - 15.5 %   Platelets 223 150 - 400 K/uL  Comprehensive metabolic panel     Status: Abnormal   Collection Time: 12/13/14 10:23 AM  Result Value Ref Range   Sodium 141 135 - 145 mmol/L   Potassium 3.0 (L) 3.5 - 5.1 mmol/L   Chloride 106 96 - 112 mmol/L   CO2 23 19 - 32 mmol/L   Glucose, Bld 113 (H) 70 - 99 mg/dL   BUN 9 6 - 23 mg/dL   Creatinine, Ser 0.77 0.50 - 1.10 mg/dL   Calcium 9.2 8.4 - 10.5 mg/dL   Total Protein 7.5 6.0 - 8.3 g/dL   Albumin 4.7 3.5 - 5.2 g/dL   AST 20 0 - 37 U/L   ALT 14 0 - 35 U/L   Alkaline Phosphatase 49 39 - 117 U/L   Total Bilirubin 0.6 0.3 - 1.2 mg/dL   GFR calc non Af Amer >90 >90 mL/min   GFR calc Af Amer >90 >90 mL/min    Comment: (NOTE) The eGFR has been calculated using the CKD EPI equation. This calculation has not been validated in all clinical situations. eGFR's persistently <90 mL/min signify  possible Chronic Kidney Disease.    Anion gap 12 5 - 15  Ethanol (ETOH)     Status: None   Collection Time: 12/13/14 10:23 AM  Result Value Ref Range   Alcohol, Ethyl (B) <5 0 - 9 mg/dL    Comment:        LOWEST DETECTABLE LIMIT FOR SERUM ALCOHOL IS 11 mg/dL FOR MEDICAL PURPOSES ONLY   Salicylate level     Status: None   Collection Time: 12/13/14 10:23 AM  Result Value Ref Range   Salicylate Lvl <2.8 2.8 - 20.0 mg/dL  POC Urine Pregnancy, ED (if pre-menopausal female) - NOT at [facility]     Status: None   Collection Time: 12/13/14 11:15 AM  Result Value Ref Range   Preg Test, Ur NEGATIVE NEGATIVE    Comment:        THE SENSITIVITY OF THIS METHODOLOGY IS >24 mIU/mL   Urine Drug Screen     Status: Abnormal   Collection Time: 12/13/14 11:16 AM  Result Value Ref Range   Opiates POSITIVE (A) NONE DETECTED   Cocaine NONE DETECTED NONE DETECTED   Benzodiazepines NONE DETECTED NONE DETECTED   Amphetamines NONE  DETECTED NONE DETECTED   Tetrahydrocannabinol POSITIVE (A) NONE DETECTED   Barbiturates NONE DETECTED NONE DETECTED    Comment:        DRUG SCREEN FOR MEDICAL PURPOSES ONLY.  IF CONFIRMATION IS NEEDED FOR ANY PURPOSE, NOTIFY LAB WITHIN 5 DAYS.        LOWEST DETECTABLE LIMITS FOR URINE DRUG SCREEN Drug Class       Cutoff (ng/mL) Amphetamine      1000 Barbiturate      200 Benzodiazepine   366 Tricyclics       294 Opiates          300 Cocaine          300 THC              50     No results found.  Pertinent items are noted in HPI. Temp:  [97.6 F (36.4 C)-98.5 F (36.9 C)] 98.5 F (36.9 C) (03/27 1349) Pulse Rate:  [77-114] 77 (03/27 1349) Resp:  [18-20] 18 (03/27 1349) BP: (117-130)/(63-72) 130/72 mmHg (03/27 1349) SpO2:  [95 %-100 %] 97 % (03/27 1349) General appearance: alert and cooperative Resp: clear to auscultation bilaterally Cardio: regular rate and rhythm GI: soft, non-tender; bowel sounds normal; no masses,  no organomegaly Extremities: extremities normal, atraumatic, no cyanosis or edema  Except for L wrist with laceration at wrist crease, fCR and PL exposed and lacerated, n/v intact   Assessment: Laceration with tendon involvement of L wrist Plan: Will explore wound repair involved structures. I have discussed this treatment plan in detail with patient and family, including the risks of the recommended treatment or surgery, the benefits and the alternatives.  The patient and caregiver understands that additional treatment may be necessary.  Daton Szilagyi CHRISTOPHER 12/13/2014, 3:18 PM

## 2014-12-14 ENCOUNTER — Encounter (HOSPITAL_COMMUNITY): Payer: Self-pay | Admitting: General Surgery

## 2014-12-14 DIAGNOSIS — X789XXA Intentional self-harm by unspecified sharp object, initial encounter: Secondary | ICD-10-CM

## 2014-12-14 DIAGNOSIS — F329 Major depressive disorder, single episode, unspecified: Secondary | ICD-10-CM

## 2014-12-14 DIAGNOSIS — T1491 Suicide attempt: Secondary | ICD-10-CM

## 2014-12-14 LAB — TSH: TSH: 5.211 u[IU]/mL — AB (ref 0.350–4.500)

## 2014-12-14 MED ORDER — POTASSIUM CHLORIDE CRYS ER 20 MEQ PO TBCR
40.0000 meq | EXTENDED_RELEASE_TABLET | Freq: Two times a day (BID) | ORAL | Status: DC
  Administered 2014-12-14 – 2014-12-16 (×5): 40 meq via ORAL
  Filled 2014-12-14 (×8): qty 2

## 2014-12-14 MED ORDER — POTASSIUM CHLORIDE CRYS ER 10 MEQ PO TBCR
10.0000 meq | EXTENDED_RELEASE_TABLET | Freq: Two times a day (BID) | ORAL | Status: DC
  Administered 2014-12-14: 10 meq via ORAL
  Filled 2014-12-14 (×3): qty 1

## 2014-12-14 MED ORDER — CEPHALEXIN 500 MG PO CAPS
500.0000 mg | ORAL_CAPSULE | Freq: Three times a day (TID) | ORAL | Status: DC
  Administered 2014-12-14 – 2014-12-17 (×8): 500 mg via ORAL
  Filled 2014-12-14: qty 8
  Filled 2014-12-14 (×3): qty 1
  Filled 2014-12-14: qty 8
  Filled 2014-12-14 (×3): qty 1
  Filled 2014-12-14: qty 2
  Filled 2014-12-14 (×2): qty 1
  Filled 2014-12-14: qty 2
  Filled 2014-12-14 (×3): qty 1
  Filled 2014-12-14: qty 8
  Filled 2014-12-14: qty 2

## 2014-12-14 MED ORDER — NICOTINE 14 MG/24HR TD PT24
14.0000 mg | MEDICATED_PATCH | Freq: Every day | TRANSDERMAL | Status: DC
  Administered 2014-12-14: 14 mg via TRANSDERMAL
  Filled 2014-12-14 (×3): qty 1

## 2014-12-14 NOTE — [facility] Group Notes (Signed)
   [facility]  12/14/2014  8:45 AM   Participation Quality: Alert, Appropriate and Oriented  Mood/Affect: Appropriate  Depression Rating: 4  Anxiety Rating: 1-2  Thoughts of Suicide: Pt denies SI/HI  Will you contract for safety? Yes  Current AVH: Pt denies  Plan for Discharge/Comments: Pt attended discharge planning group and actively participated in group. CSW provided pt with today's workbook. Patient reports feeling "better" today. [place] and can return to her current living situation. Patient would like a referral to outpatient services at discharge.   Transportation Means: Pt reports access to transportation  Supports: No supports mentioned at this time  Samuella BruinKristin Berdie Malter, MSW, [employer]CSWA Clinical Social Worker [facility] [phone]

## 2014-12-14 NOTE — [facility] Group Notes (Signed)
[facility] LCSW Group Therapy 12/14/2014  1:15 pm  Type of Therapy: Group Therapy Participation Level: Active  Participation Quality: Attentive, Sharing and Supportive  Affect: Depressed and Flat  Cognitive: Alert and Oriented  Insight: Developing/Improving and Engaged  Engagement in Therapy: Developing/Improving and Engaged  Modes of Intervention: Clarification, Confrontation, Discussion, Education, Exploration,  Limit-setting, Orientation, Problem-solving, Rapport Building, Dance movement psychotherapisteality Testing, Socialization and Support  Summary of Progress/Problems: Pt identified obstacles faced currently and processed barriers involved in overcoming these obstacles. Pt identified steps necessary for overcoming these obstacles and explored motivation (internal and external) for facing these difficulties head on. Pt further identified one area of concern in their lives and chose a goal to focus on for today. Patient expressed difficulty expressing her emotions to others due to not having a strong support system. Group discussed ways in which people can find support in the community. Patient reports that she is interested in outpatient services at discharge. CSW and other group members provided patient with emotional support and encouragement.  Samuella BruinKristin Clayton Bosserman, MSW, [employer]CSWA Clinical Social Worker [facility] [phone]

## 2014-12-14 NOTE — [place] Notes (Signed)
Recreation Therapy Notes  Date: 03.28.2016 Time: 9:30am Location: [address] Group Room   Group Topic: Stress Management  Goal Area(s) Addresses:  Patient will actively participate in stress management techniques presented during session.   Behavioral Response: Did not attend.   Maclin Guerrette L Ashleen Demma, LRT/CTRS  Taelyr Jantz L 12/14/2014 10:16 AM 

## 2014-12-14 NOTE — Progress Notes (Signed)
D:Patient in the dayroom on approach.  Patient appears flat deperessed.  Patient states she had an ok day.  Patient states the highlight of her da was her boyfriend visiting her.  Patient states her pain has been under control but states her left wrist does hurt.  Patient left hand dressing clean dry and intact.  Patient has sensation to all digits on left hand. Capillary refill rapid to all digits. Patient denies SI/HI and denies AVH. A: Staff to monitor Q 15 mins for safety.  Encouragement and support offered.  Scheduled medications administered per orders. R: Patient remains safe on the unit.  Patient attended group tonight.  Patient visible on the unit.  Patient taking administered medications.

## 2014-12-14 NOTE — [facility] Notes (Signed)
[facility] Group Notes:  (Nursing/MHT/Case Management/Adjunct)  Date:  12/14/2014  Time:  11:53 PM  Type of Therapy:  Group Therapy  Participation Level:  Minimal  Participation Quality:  Appropriate  Affect:  Appropriate  Cognitive:  Appropriate  Insight:  Appropriate  Engagement in Group:  Engaged  Modes of Intervention:  Socialization and Support  Summary of Progress/Problems: Pt. Was engaged and had good insight in group discussion.  Pt. Stated she practise martial arts when feeling well about herself.  Sondra ComeWilson, Mizuki Hoel J 12/14/2014, 11:53 PM
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
## 2014-12-14 NOTE — Progress Notes (Signed)
D: Patient denies SI/HI and A/V hallucinations; patient reports sleep is fair; reports appetite is good; reports energy level is low ; reports ability to concentrate is good; rates depression as 1/10; rates hopelessness 0/10; rates anxiety as 0/10;   A: Monitored q 15 minutes; patient encouraged to attend groups; patient educated about medications; patient given medications per physician orders; patient encouraged to express feelings and/or concerns  R: Patient is flat, sad, and depressed; patient is cooperative; patient reports some relief of her arm pain; patient's interaction with staff and peers is minimal; patient forwards little information patient was able to set goal to talk with staff 1:1 when having feelings of SI; patient is taking medications as prescribed and tolerating medications; patient is attending all groups

## 2014-12-15 LAB — BASIC METABOLIC PANEL
ANION GAP: 10 (ref 5–15)
BUN: 6 mg/dL (ref 6–23)
CALCIUM: 10.1 mg/dL (ref 8.4–10.5)
CO2: 21 mmol/L (ref 19–32)
Chloride: 108 mmol/L (ref 96–112)
Creatinine, Ser: 0.73 mg/dL (ref 0.50–1.10)
GFR calc non Af Amer: >90 mL/min (ref >90)
Glucose, Bld: 100 mg/dL — ABNORMAL HIGH (ref 70–99)
Potassium: 3.9 mmol/L (ref 3.5–5.1)
Sodium: 139 mmol/L (ref 135–145)

## 2014-12-15 LAB — CBC
HEMATOCRIT: 46.4 % — AB (ref 36.0–46.0)
HEMOGLOBIN: 15.9 g/dL — AB (ref 12.0–15.0)
MCH: 31.2 pg (ref 26.0–34.0)
MCHC: 34.3 g/dL (ref 30.0–36.0)
MCV: 91 fL (ref 78.0–100.0)
Platelets: 200 10*3/uL (ref 150–400)
RBC: 5.1 MIL/uL (ref 3.87–5.11)
RDW: 13.8 % (ref 11.5–15.5)
WBC: 6.2 10*3/uL (ref 4.0–10.5)

## 2014-12-15 LAB — T4, FREE: Free T4: 1.59 ng/dL (ref 0.80–1.80)

## 2014-12-15 LAB — HEMOGLOBIN A1C
Hgb A1c MFr Bld: 5.2 % (ref 4.8–5.6)
MEAN PLASMA GLUCOSE: 103 mg/dL

## 2014-12-15 LAB — MAGNESIUM: MAGNESIUM: 1.9 mg/dL (ref 1.5–2.5)

## 2014-12-15 MED ORDER — NICOTINE POLACRILEX 2 MG MT GUM
2.0000 mg | CHEWING_GUM | OROMUCOSAL | Status: DC | PRN
  Administered 2014-12-15 – 2014-12-17 (×11): 2 mg via ORAL
  Filled 2014-12-15 (×5): qty 1

## 2014-12-15 NOTE — Tx Team (Signed)
Interdisciplinary Treatment Plan Update (Adult) Date: 12/15/2014   Time Reviewed: 9:30 AM  Progress in Treatment: Attending groups: Yes Participating in groups: Yes Taking medication as prescribed: Yes Tolerating medication: Yes Family/Significant other contact made: No, CSW assessing for appropriate contacts Patient understands diagnosis: Yes Discussing patient identified problems/goals with staff: Yes Medical problems stabilized or resolved: Yes Denies suicidal/homicidal ideation: Yes Issues/concerns per patient self-inventory: Yes Other:  New problem(s) identified: N/A  Discharge Plan or Barriers:   3/29: Patient plans to return home and follow up with outpatient services yet to be determined.   Reason for Continuation of Hospitalization:  Depression Anxiety Medication Stabilization   Comments: N/A  Estimated length of stay: 3-5 days  For review of initial/current patient goals, please see plan of care. Patient is a 24 year old female admitted for depression, SI, and laceration to arm. [place] Patient will benefit from crisis stabilization, medication evaluation, group therapy, and psycho education in addition to case management for discharge planning. Patient and CSW reviewed pt's identified goals and treatment plan. Pt verbalized understanding and agreed to treatment plan.  Attendees: Patient:    Family:    Physician: Dr. Jama Flavorsobos 12/15/2014 9:30 AM  Nursing: Melody HaverSara Twyman, Patrice White RN 12/15/2014 9:30 AM  Clinical Social Worker: Belenda CruiseKristin Jaslen Adcox, LCSWA 12/15/2014 9:30 AM  Other: Juline PatchQuylle Hodnett, LCSW 12/15/2014 9:30 AM  Other: Leisa LenzValerie Enoch, [facility] 12/15/2014 9:30 AM  Other: Onnie BoerJennifer Clark, CM 12/15/2014 9:30 AM  Other: Serena ColonelAggie Nwoko, NP 12/15/2014 9:30 AM                    Scribe for Treatment Team:  Samuella BruinKristin Kaydyn Chism, MSW, LCSWA [phone]

## 2014-12-15 NOTE — Progress Notes (Signed)
[facility] Group Notes:  (Nursing/MHT/Case Management/Adjunct)  Date:  12/15/2014  Time:  11:53 PM  Type of Therapy:  Group Therapy  Participation Level:  Active  Participation Quality:  Appropriate  Affect:  Appropriate  Cognitive:  Appropriate  Insight:  Appropriate  Engagement in Group:  Engaged  Modes of Intervention:  Socialization and Support  Summary of Progress/Problems: Pt. Was engaged in group discussion of recovery.  Pt. Stated her recovery is being more social and able to open up to people.  Sondra ComeWilson, Elliot Meldrum J 12/15/2014, 11:53 PM

## 2014-12-15 NOTE — Progress Notes (Signed)
Pt attended spiritual care group on grief and loss facilitated by chaplain Burnis KingfisherMatthew Stalnaker and counseling intern Kayla Suprina [place]. Group opened with brief discussion and psycho-social ed around grief and loss in relationships and in relation to self - identifying life patterns, circumstances, changes that cause losses. Established group norm of speaking from own life experience. Group goal of establishing open and affirming space for members to share loss and experience with grief, normalize grief experience and provide psycho social education and grief support.  Group drew on narrative and Alderian therapeutic modalities.   Kayla ParodyCaitlin was present throughout most of the group until stepping out for treatment team toward the end. Did not contribute to group discussion, but was tearful at beginning of group while another patient shared about the loss of his fiance to cancer. Appeared attentive to other's during discussion.   Kayla Yates Counseling Intern

## 2014-12-15 NOTE — [facility] Group Notes (Signed)
[facility] LCSW Group Therapy  12/15/2014   1:15 PM   Type of Therapy:  Group Therapy  Participation Level:  Active  Participation Quality:  Attentive, Sharing and Supportive  Affect:  Depressed and Flat  Cognitive:  Alert and Oriented  Insight:  Developing/Improving and Engaged  Engagement in Therapy:  Developing/Improving and Engaged  Modes of Intervention:  Clarification, Confrontation, Discussion, Education, Exploration, Limit-setting, Orientation, Problem-solving, Rapport Building, Dance movement psychotherapisteality Testing, Socialization and Support  Summary of Progress/Problems: The topic for group therapy was feelings about diagnosis.  Pt actively participated in group discussion on their past and current diagnosis and how they feel towards this.  Pt also identified how society and family members judge them, based on their diagnosis as well as stereotypes and stigmas.  Patient reported feeling "embarrassed" by her hospitalization. Patient engaged in discussion regarding importance of mental health in comparison to physical health, and also discussed society's stigma. Patient reports that she would like to work on communicating more effectively, asking for help, and also has set a goal to participate in a body painting convention. CSW and other group members provided patient with emotional support and encouragement.   Kayla BruinKristin Cassaundra Yates, MSW, [employer]CSWA Clinical Social Worker [facility] [phone]

## 2014-12-15 NOTE — Plan of Care (Signed)
Problem: Alteration in mood Goal: LTG-Patient reports reduction in suicidal thoughts (Patient reports reduction in suicidal thoughts and is able to verbalize a safety plan for whenever patient is feeling suicidal)  Outcome: Progressing Patient denies SI and self harming thoughts.  Patient verbally contract for safety.

## 2014-12-15 NOTE — [facility] Group Notes (Signed)
[facility] Group Notes:  (Nursing/MHT/Case Management/Adjunct)  Date:  12/15/2014  Time:  0930  Type of Therapy:  Nurse Education  Participation Level:  Active  Participation Quality:  Appropriate  Affect:  Appropriate  Cognitive:  Appropriate  Insight:  Appropriate  Engagement in Group:  Engaged  Modes of Intervention:  Discussion and Education  Summary of Progress/Problems:  Kayla Yates, Kayla Yates 12/15/2014, 10:16 AM

## 2014-12-15 NOTE — Progress Notes (Addendum)
[facility] MD Progress Note  12/15/2014 6:01 PM Kayla Yates  MRN:  [id number] Subjective:   Patient states she is " feeling OK". Reports she is not feeling particularly depressed, and is hoping she can be discharged soon. She denies any severe pain on wound site, and described preserved finger movements and sensation. Objective : I have discussed case with treatment team and have met with patient. As per staff, patient has been visible on the unit , going to groups, but presenting somewhat depressed and constricted in affect. She was noted to be crying in one of the groups,  And  states this was because another patient was talking about death of loved one  At the time. She minimizes depression at this time and states " I am not really a depressed person, normally I am happy, I just felt bad after my grandmother died ". She denies any ongoing self injurious ideations and has not presented with any self injurious ideations or behaviors . As noted, reports that her forearm/wrist wound ( currently bandaged ) is hurting less, and has preserved digit movement and sensation. No fever, no chills . We discussed starting an antidepressant medication. She expressed reluctance, stating she is " not into medications, I would rather do therapy ". She is concerned about possible medication side effects as well. She is looking forward to discharge soon, and states " I want to get back to my dogs, my fiance and my work".  Labs reviewed as below, TSH slightly elevated, but T4 WNL and patient not endorsing symptoms of hypothyroidism. K+ normalized at 3.9 Principal Problem: Suicide and self-inflicted injury by cutting and piercing instrument Diagnosis:   Patient Active Problem List   Diagnosis Date Noted  . Suicide and self-inflicted injury by cutting and piercing instrument [X78.9XXA] 12/13/2014  . Major depression, single episode [F32.9] 12/13/2014    Class: Acute   Total Time spent with patient: 25 minutes     Past Medical History:  Past Medical History  Diagnosis Date  . Asthma   . Suicide and self-inflicted injury by cutting and piercing instrument     Past Surgical History  Procedure Laterality Date  . Tonsillectomy    . Ankle surgery    . Incision and drainage of wound Left 12/13/2014    Procedure: IRRIGATION AND DEBRIDEMENT  WRIST, EXPLORATION OF MEDIAL NERVE AND RADIAL ARTERY, REPAIR OF FCR, PL, & FCU.;  Surgeon: [name], MD;  Location: [place];  Service: Plastics;  Laterality: Left;   Family History: History reviewed. No pertinent family history. Social History:  History  Alcohol Use  . Yes    Comment: soc     History  Drug Use No    History   Social History  . Marital Status: Single    Spouse Name: N/A  . Number of Children: N/A  . Years of Education: N/A   Social History Main Topics  . Smoking status: Current Every Day Smoker    Types: Cigarettes  . Smokeless tobacco: Not on file  . Alcohol Use: Yes     Comment: soc  . Drug Use: No  . Sexual Activity: Yes    Birth Control/ Protection: None   Other Topics Concern  . None   Social History Narrative   Additional History:    Sleep: Good  Appetite:  Good   Assessment:   Musculoskeletal: Strength & Muscle Tone: within normal limits Gait & Station: normal Patient leans: N/A   Psychiatric Specialty Exam: Physical Exam  ROS- denies headache, denies fever of chills, no chest pain, no SOB, no coughing, no vomiting, pain on  Wound site subsiding  Blood pressure 94/74, pulse 123, temperature 97.8 F (36.6 C), temperature source Oral, resp. rate 18, height 5' 4.25" (1.632 m), weight 144 lb (65.318 kg), last menstrual period 12/06/2014, SpO2 100 %.Body mass index is 24.52 kg/(m^2).  General Appearance: improved grooming  Eye Contact::  Good  Speech:  Normal Rate  Volume:  Normal  Mood:  states mood is "OK", minimizes any depression at this time, affect is constricted but reactive   Affect:   constricted but does smile at times appropriately  Thought Process:  Goal Directed and Linear  Orientation:  Full (Time, Place, and Person)  Thought Content:  no hallucinations, no delusions  Suicidal Thoughts:  No At this time denies any suicidal plan or intent and contracts for safety on the unit  Homicidal Thoughts:  No  Memory:  Recent and Remote grossly intact  Judgement:  Fair  Insight:  Present  Psychomotor Activity:  Normal  Concentration:  Good  Recall:  Good  Fund of Knowledge:Good  Language: Good  Akathisia:  Negative  Handed:  Right  AIMS (if indicated):     Assets:  Communication Skills Desire for Improvement Resilience Social Support  ADL's:  Improving   Cognition: WNL  Sleep:  Number of Hours: 6.25     Current Medications: Current Facility-Administered Medications  Medication Dose Route Frequency Provider Last Rate Last Dose  . acetaminophen (TYLENOL) tablet 650 mg  650 mg Oral Q6H PRN Harriet Butte, NP      . alum & mag hydroxide-simeth (MAALOX/MYLANTA) 200-200-20 MG/5ML suspension 30 mL  30 mL Oral Q4H PRN Harriet Butte, NP      . cephALEXin (KEFLEX) capsule 500 mg  500 mg Oral 3 times per day Laverle Hobby, PA-C   500 mg at 12/15/14 1439  . hydrOXYzine (ATARAX/VISTARIL) tablet 25 mg  25 mg Oral Q6H PRN Harriet Butte, NP      . magnesium hydroxide (MILK OF MAGNESIA) suspension 30 mL  30 mL Oral Daily PRN Harriet Butte, NP      . nicotine polacrilex (NICORETTE) gum 2 mg  2 mg Oral PRN Jenne Campus, MD   2 mg at 12/15/14 1623  . oxyCODONE-acetaminophen (PERCOCET/ROXICET) 5-325 MG per tablet 2 tablet  2 tablet Oral Q6H PRN Harriet Butte, NP   2 tablet at 12/15/14 1307  . potassium chloride SA (K-DUR,KLOR-CON) CR tablet 40 mEq  40 mEq Oral BID Laverle Hobby, PA-C   40 mEq at 12/15/14 1707  . traZODone (DESYREL) tablet 50 mg  50 mg Oral QHS PRN Harriet Butte, NP        Lab Results:  Results for orders placed or performed during the hospital  encounter of 12/13/14 (from the past 48 hour(s))  TSH     Status: Abnormal   Collection Time: 12/14/14  6:35 AM  Result Value Ref Range   TSH 5.211 (H) 0.350 - 4.500 uIU/mL    Comment: Performed at [facility]  Hemoglobin A1c     Status: None   Collection Time: 12/14/14  6:35 AM  Result Value Ref Range   Hgb A1c MFr Bld 5.2 4.8 - 5.6 %    Comment: (NOTE)         Pre-diabetes: 5.7 - 6.4         Diabetes: >6.4  Glycemic control for adults with diabetes: <7.0    Mean Plasma Glucose 103 mg/dL    Comment: (NOTE) Performed At: [facility] [place], [place] [id number] Lindon Romp MD NU:[id number] Performed at [facility] metabolic panel     Status: Abnormal   Collection Time: 12/15/14  7:00 AM  Result Value Ref Range   Sodium 139 135 - 145 mmol/L   Potassium 3.9 3.5 - 5.1 mmol/L   Chloride 108 96 - 112 mmol/L   CO2 21 19 - 32 mmol/L   Glucose, Bld 100 (H) 70 - 99 mg/dL   BUN 6 6 - 23 mg/dL   Creatinine, Ser 0.73 0.50 - 1.10 mg/dL   Calcium 10.1 8.4 - 10.5 mg/dL   GFR calc non Af Amer >90 >90 mL/min   GFR calc Af Amer >90 >90 mL/min    Comment: (NOTE) The eGFR has been calculated using the CKD EPI equation. This calculation has not been validated in all clinical situations. eGFR's persistently <90 mL/min signify possible Chronic Kidney Disease.    Anion gap 10 5 - 15    Comment: Performed at [facility]  T4, free     Status: None   Collection Time: 12/15/14  7:00 AM  Result Value Ref Range   Free T4 1.59 0.80 - 1.80 ng/dL    Comment: Performed at [facility]  Magnesium     Status: None   Collection Time: 12/15/14  7:00 AM  Result Value Ref Range   Magnesium 1.9 1.5 - 2.5 mg/dL    Comment: Performed at [facility]  CBC     Status: Abnormal   Collection Time: 12/15/14  7:00 AM  Result Value Ref Range   WBC 6.2 4.0 - 10.5 K/uL   RBC 5.10 3.87 -  5.11 MIL/uL   Hemoglobin 15.9 (H) 12.0 - 15.0 g/dL   HCT 46.4 (H) 36.0 - 46.0 %   MCV 91.0 78.0 - 100.0 fL   MCH 31.2 26.0 - 34.0 pg   MCHC 34.3 30.0 - 36.0 g/dL   RDW 13.8 11.5 - 15.5 %   Platelets 200 150 - 400 K/uL    Comment: Performed at [facility]    Physical Findings: AIMS: Facial and Oral Movements Muscles of Facial Expression: None, normal Lips and Perioral Area: None, normal Jaw: None, normal Tongue: None, normal,Extremity Movements Upper (arms, wrists, hands, fingers): None, normal Lower (legs, knees, ankles, toes): None, normal, Trunk Movements Neck, shoulders, hips: None, normal, Overall Severity Severity of abnormal movements (highest score from questions above): None, normal Incapacitation due to abnormal movements: None, normal Patient's awareness of abnormal movements (rate only patient's report): No Awareness, Dental Status Current problems with teeth and/or dentures?: No Does patient usually wear dentures?: No  CIWA:  CIWA-Ar Total: 0 COWS:      Assessment- at this time patient reporting improvement, denying significant depression, and denying any SI or self injurious ideations. Affect,, however, does present as constricted, but reactive. Minimizes any neuro-vegetative symptoms of depression at this time. Not interested in starting any antidepressant at the present time.  Treatment Plan Summary: Daily contact with patient to assess and evaluate symptoms and progress in treatment, Medication management, Plan continue inpatient treatment and will continue to monitor mood and affect  Patient not on any standing psychiatric medication at this time- see above  Medical Decision Making:  Established Problem, Stable/Improving (1), Review of Psycho-Social Stressors (  1), Review or order clinical lab tests (1) and Review of Medication Regimen & Side Effects (2)     Kayla Yates 12/15/2014, 6:01 PM

## 2014-12-15 NOTE — Progress Notes (Signed)
Patient ID: Zella RicherCaitlin Dinning, female   DOB: [birth year], 24 y.o.   MRN: [id number] D: client c/o of pain in left wrist at 9/10. Client visible interacts appropriately with staff and peers. Client reports "I'm sad, because I'm homesick"  Client's goal: "work on opening up more" A: Clinical research associateWriter provided emotional support,  administered Oxycodone 5/325 mg 2 tabs for pain (see MAR). Assessed left arm, bandages dry, intact, client can move fingers freely, capillary refill <2 seconds. Staff will monitor q7315min for safety. R: Client is safe on the unit.
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
## 2014-12-15 NOTE — B[facility] Suicide Risk Assessment (Signed)
[facility] INPATIENT:  Family/Significant Other Suicide Prevention Education  Suicide Prevention Education:  Education Completed; fiance Conception OmsChris Jones [phone],  (name of family member/significant other) has been identified by the patient as the family member/significant other with whom the patient will be residing, and identified as the person(s) who will aid the patient in the event of a mental health crisis (suicidal ideations/suicide attempt).  With written consent from the patient, the family member/significant other has been provided the following suicide prevention education, prior to the and/or following the discharge of the patient.  The suicide prevention education provided includes the following:  Suicide risk factors  Suicide prevention and interventions  National Suicide Hotline telephone number  [facility] assessment telephone number  [facility] Emergency Assistance 911  [facility] and/or Residential Mobile Crisis Unit telephone number  Request made of family/significant other to:  Remove weapons (e.g., guns, rifles, knives), all items previously/currently identified as safety concern.    Remove drugs/medications (over-the-counter, prescriptions, illicit drugs), all items previously/currently identified as a safety concern.  The family member/significant other verbalizes understanding of the suicide prevention education information provided.  The family member/significant other agrees to remove the items of safety concern listed above.  Eliz Nigg, [place]Kristin L 12/15/2014, 3:11 PM

## 2014-12-15 NOTE — Progress Notes (Signed)
Patient ID: Kayla Yates, female   DOB: 05-09-1991, 24 y.o.   MRN: [id number]   Pt currently presents with a blunted affect and depressed behavior. Per self inventory, pt rates depression at a 2, hopelessness 0 and anxiety 0. Pt's daily goal is to "work on opening up more" and they intend to do so by "speak to others." Pt reports good sleep, good concentration and a fair appetite. Pt also reports a normal energy level. Pt reports pain in her wrist.  Pt provided with medications per providers orders. Pt's labs and vitals were monitored throughout the day. Pt supported emotionally and encouraged to express concerns and questions. Pt educated on medications and self harm coping skills. Pt arm assessed and elevated, see daily assessment.   Pt's safety ensured with 15 minute and environmental checks. Pt currently denies SI/HI and A/V hallucinations. Pt verbally agrees to seek staff if SI/HI or A/VH occurs and to consult with staff before acting on these thoughts. Pt attends and participates in groups today.

## 2014-12-16 LAB — T3, FREE: T3, Free: 5 pg/mL — ABNORMAL HIGH (ref 2.0–4.4)

## 2014-12-16 NOTE — Progress Notes (Addendum)
Pt currently presents with a flat affect and depressed behavior. Per self inventory, pt rates depression, hopelessness and anxiety at a 0. Pt's daily goal is "keep from being home sick. To keep my spirits lifted" and they intend to do so by "speak to friends/counselors. Call family. Distract myself." Pt reports good sleep, concentration and appetite. Pt reports "I feel better today." When asked what has helped her the most, pt states "I think being around other people who have experienced the same things I have."  Pt provided with scheduled and prn medications per providers orders. Pt's labs and vitals were monitored throughout the day. Pt supported emotionally and encouraged to express concerns and questions. Pt educated on medications and deep breathing. Pt dressing dry/intact and no drainage noted.   Pt's safety ensured with 15 minute and environmental checks. Pt currently denies SI/HI and A/V hallucinations. Pt verbally agrees to seek staff if SI/HI or A/VH occurs and to consult with staff before acting on these thoughts.

## 2014-12-16 NOTE — [facility] Group Notes (Signed)
   [facility]  12/16/2014  8:45 AM   Participation Quality: Alert, Appropriate and Oriented  Mood/Affect: Depressed and Flat  Depression Rating: 0  Anxiety Rating: 0  Thoughts of Suicide: Pt denies SI/HI  Will you contract for safety? Yes  Current AVH: Pt denies  Plan for Discharge/Comments: Pt attended discharge planning group and actively participated in group. CSW provided pt with today's workbook. Patient reports feeling "okay" today and reports wanting to return home. Patient plans to return home to follow up with outpatient therapy services.  Transportation Means: Pt reports access to transportation  Supports: No supports mentioned at this time  Samuella BruinKristin Skya Mccullum, MSW, [employer]CSWA Clinical Social Worker [facility] [phone]

## 2014-12-16 NOTE — Progress Notes (Signed)
Recreation Therapy Notes  Date: 03.30.2016 Time: 9:30am Location: [address] Group Room   Group Topic: Stress Management  Goal Area(s) Addresses:  Patient will actively participate in stress management techniques presented during session.   Behavioral Response: Did not attend.   Jerel Sardina L Debanhi Blaker, LRT/CTRS  Maxx Pham L 12/16/2014 3:09 PM 

## 2014-12-16 NOTE — [facility] Group Notes (Signed)
[facility] LCSW Group Therapy 12/16/2014  1:15 PM Type of Therapy: Group Therapy Participation Level: Active  Participation Quality: Attentive, Sharing and Supportive  Affect: Appropriate  Cognitive: Alert and Oriented  Insight: Developing/Improving and Engaged  Engagement in Therapy: Developing/Improving and Engaged  Modes of Intervention: Clarification, Confrontation, Discussion, Education, Exploration, Limit-setting, Orientation, Problem-solving, Rapport Building, Dance movement psychotherapisteality Testing, Socialization and Support  Summary of Progress/Problems: The topic for group today was emotional regulation. This group focused on both positive and negative emotion identification and allowed group members to process ways to identify feelings, regulate negative emotions, and find healthy ways to manage internal/external emotions. Group members were asked to reflect on a time when their reaction to an emotion led to a negative outcome and explored how alternative responses using emotion regulation would have benefited them. Group members were also asked to discuss a time when emotion regulation was utilized when a negative emotion was experienced. Patient discussed experiencing sadness and loneliness and having difficulty communicating her feelings to others. Patient reports that she had a good visit with her boyfriend's mother last night and has made some friendships during her hospitalization. Patient states that she is excited to return home tomorrow. CSW and other group members provided patient with emotional support and encouragement.  Kayla Yates, MSW, [employer]CSWA Clinical Social Worker [facility] [phone]

## 2014-12-16 NOTE — Plan of Care (Signed)
Problem: Ineffective individual coping Goal: LTG: Patient will report a decrease in negative feelings Outcome: Progressing Pt states "I feel better today."

## 2014-12-16 NOTE — Progress Notes (Addendum)
Patient ID: Kayla Yates, female   DOB: 1990/12/26, 24 y.o.   MRN: [id number] [facility] MD Progress Note  12/16/2014 1:12 PM Kayla Yates  MRN:  [id number] Subjective:   She is feeling better, and today denies depression. She states she had a good visit from her fiance and her fiance's mother, and she feels loved and supported by her family. The arm/ wrist wound is painful, but it has improved .   Objective : I have discussed case with treatment team and have met with patient. She presents with improved mood and today she denies depression and also presents with a fuller range of affect, smiles and seems less constricted. 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 She has been going to groups and is visible on unit, interacting with peers. As noted, she has been reluctant to start an antidepressant, and by today she does seem better, less depressed. Sleep improved, appetite improved, no anhedonia. Principal Problem: Suicide and self-inflicted injury by cutting and piercing instrument Diagnosis:   Patient Active Problem List   Diagnosis Date Noted  . Suicide and self-inflicted injury by cutting and piercing instrument [X78.9XXA] 12/13/2014  . Major depression, single episode [F32.9] 12/13/2014    Class: Acute   Total Time spent with patient: 25 minutes    Past Medical History:  Past Medical History  Diagnosis Date  . Asthma   . Suicide and self-inflicted injury by cutting and piercing instrument     Past Surgical History  Procedure Laterality Date  . Tonsillectomy    . Ankle surgery    . Incision and drainage of wound Left 12/13/2014    Procedure: IRRIGATION AND DEBRIDEMENT  WRIST, EXPLORATION OF MEDIAL NERVE AND RADIAL ARTERY, REPAIR OF FCR, PL, & FCU.;  Surgeon: [name], MD;  Location: [place];  Service: Plastics;  Laterality: Left;   Family History: History reviewed. No pertinent  family history. Social History:  History  Alcohol Use  . Yes    Comment: soc     History  Drug Use No    History   Social History  . Marital Status: Single    Spouse Name: N/A  . Number of Children: N/A  . Years of Education: N/A   Social History Main Topics  . Smoking status: Current Every Day Smoker    Types: Cigarettes  . Smokeless tobacco: Not on file  . Alcohol Use: Yes     Comment: soc  . Drug Use: No  . Sexual Activity: Yes    Birth Control/ Protection: None   Other Topics Concern  . None   Social History Narrative   Additional History:    Sleep: fair   Appetite:  Good   Assessment:   Musculoskeletal: Strength & Muscle Tone: within normal limits Gait & Station: normal Patient leans: N/A   Psychiatric Specialty Exam: Physical Exam  ROS- denies headache, denies fever of chills, no chest pain, no SOB, no coughing, no vomiting, pain on  Wound site subsiding, no bleeding , no signs of infections   Blood pressure 118/63, pulse 130, temperature 98.1 F (36.7 C), temperature source Oral, resp. rate 16, height 5' 4.25" (1.632 m), weight 144 lb (65.318 kg), last menstrual period 12/06/2014, SpO2 100 %.Body mass index is 24.52 kg/(m^2).  General Appearance: improved grooming  Eye Contact::  Good  Speech:  Normal Rate  Volume:  Normal  Mood:  improving   Affect:  more reactive, smiling appropriately  Thought Process:  Goal Directed and Linear  Orientation:  Full (Time, Place, and Person)  Thought Content:  no hallucinations, no delusions  Suicidal Thoughts:  No At this time denies any suicidal plan or intent and contracts for safety on the unit  Homicidal Thoughts:  No  Memory:  Recent and Remote grossly intact  Judgement:  Other:  improved  Insight:  Present  Psychomotor Activity:  Normal  Concentration:  Good  Recall:  Good  Fund of Knowledge:Good  Language: Good  Akathisia:  Negative  Handed:  Right  AIMS (if indicated):     Assets:   Communication Skills Desire for Improvement Resilience Social Support  ADL's:  Improving   Cognition: WNL  Sleep:  Number of Hours: 5.5     Current Medications: Current Facility-Administered Medications  Medication Dose Route Frequency Provider Last Rate Last Dose  . acetaminophen (TYLENOL) tablet 650 mg  650 mg Oral Q6H PRN Harriet Butte, NP      . alum & mag hydroxide-simeth (MAALOX/MYLANTA) 200-200-20 MG/5ML suspension 30 mL  30 mL Oral Q4H PRN Harriet Butte, NP      . cephALEXin (KEFLEX) capsule 500 mg  500 mg Oral 3 times per day Laverle Hobby, PA-C   500 mg at 12/16/14 3382  . hydrOXYzine (ATARAX/VISTARIL) tablet 25 mg  25 mg Oral Q6H PRN Harriet Butte, NP      . magnesium hydroxide (MILK OF MAGNESIA) suspension 30 mL  30 mL Oral Daily PRN Harriet Butte, NP      . nicotine polacrilex (NICORETTE) gum 2 mg  2 mg Oral PRN Jenne Campus, MD   2 mg at 12/16/14 1256  . oxyCODONE-acetaminophen (PERCOCET/ROXICET) 5-325 MG per tablet 2 tablet  2 tablet Oral Q6H PRN Harriet Butte, NP   2 tablet at 12/16/14 1256  . potassium chloride SA (K-DUR,KLOR-CON) CR tablet 40 mEq  40 mEq Oral BID Laverle Hobby, PA-C   40 mEq at 12/16/14 0800  . traZODone (DESYREL) tablet 50 mg  50 mg Oral QHS PRN Harriet Butte, NP        Lab Results:  Results for orders placed or performed during the hospital encounter of 12/13/14 (from the past 48 hour(s))  Basic metabolic panel     Status: Abnormal   Collection Time: 12/15/14  7:00 AM  Result Value Ref Range   Sodium 139 135 - 145 mmol/L   Potassium 3.9 3.5 - 5.1 mmol/L   Chloride 108 96 - 112 mmol/L   CO2 21 19 - 32 mmol/L   Glucose, Bld 100 (H) 70 - 99 mg/dL   BUN 6 6 - 23 mg/dL   Creatinine, Ser 0.73 0.50 - 1.10 mg/dL   Calcium 10.1 8.4 - 10.5 mg/dL   GFR calc non Af Amer >90 >90 mL/min   GFR calc Af Amer >90 >90 mL/min    Comment: (NOTE) The eGFR has been calculated using the CKD EPI equation. This calculation has not been validated  in all clinical situations. eGFR's persistently <90 mL/min signify possible Chronic Kidney Disease.    Anion gap 10 5 - 15    Comment: Performed at [facility]  T3, free     Status: Abnormal   Collection Time: 12/15/14  7:00 AM  Result Value Ref Range   T3, Free 5.0 (H) 2.0 - 4.4 pg/mL    Comment: (NOTE) Performed At: BN  [facility] [place], [place] [id number] Lindon Romp MD AT:[id number] Performed at [facility]   T4, free     Status: None   Collection Time: 12/15/14  7:00 AM  Result Value Ref Range   Free T4 1.59 0.80 - 1.80 ng/dL    Comment: Performed at [facility]  Magnesium     Status: None   Collection Time: 12/15/14  7:00 AM  Result Value Ref Range   Magnesium 1.9 1.5 - 2.5 mg/dL    Comment: Performed at [facility]  CBC     Status: Abnormal   Collection Time: 12/15/14  7:00 AM  Result Value Ref Range   WBC 6.2 4.0 - 10.5 K/uL   RBC 5.10 3.87 - 5.11 MIL/uL   Hemoglobin 15.9 (H) 12.0 - 15.0 g/dL   HCT 46.4 (H) 36.0 - 46.0 %   MCV 91.0 78.0 - 100.0 fL   MCH 31.2 26.0 - 34.0 pg   MCHC 34.3 30.0 - 36.0 g/dL   RDW 13.8 11.5 - 15.5 %   Platelets 200 150 - 400 K/uL    Comment: Performed at [facility]    Physical Findings: AIMS: Facial and Oral Movements Muscles of Facial Expression: None, normal Lips and Perioral Area: None, normal Jaw: None, normal Tongue: None, normal,Extremity Movements Upper (arms, wrists, hands, fingers): None, normal Lower (legs, knees, ankles, toes): None, normal, Trunk Movements Neck, shoulders, hips: None, normal, Overall Severity Severity of abnormal movements (highest score from questions above): None, normal Incapacitation due to abnormal movements: None, normal Patient's awareness of abnormal movements (rate only patient's report): No Awareness, Dental Status Current problems with teeth and/or dentures?: No Does patient  usually wear dentures?: No  CIWA:  CIWA-Ar Total: 0 COWS:      Assessment- at this time improved, denies depression, presents with fuller range of affect. Behavior on unit calm and in good control. Of note, not currently on any psychiatric medications.  Treatment Plan Summary: Daily contact with patient to assess and evaluate symptoms and progress in treatment, Medication management, Plan continue inpatient treatment and will continue to monitor mood and affect  Patient not on any standing psychiatric medication at this time- see above  Medical Decision Making:  Established Problem, Stable/Improving (1), Review of Psycho-Social Stressors (1) and Review or order clinical lab tests (1)     COBOS, FERNANDO 12/16/2014, 1:12 PM

## 2014-12-17 DIAGNOSIS — F321 Major depressive disorder, single episode, moderate: Principal | ICD-10-CM | POA: Insufficient documentation

## 2014-12-17 MED ORDER — CEPHALEXIN 500 MG PO CAPS: 500.0000 mg | ORAL_CAPSULE | Freq: Three times a day (TID) | ORAL | Status: DC

## 2014-12-17 MED ORDER — TRAZODONE HCL 50 MG PO TABS: 50.0000 mg | ORAL_TABLET | Freq: Every evening | ORAL | Status: DC | PRN

## 2014-12-17 MED ORDER — NICOTINE POLACRILEX 2 MG MT GUM: 2.0000 mg | CHEWING_GUM | OROMUCOSAL | Status: DC | PRN

## 2014-12-17 NOTE — Progress Notes (Signed)
D: Pt presents appropriate in affect and mood. Pt reports that she plans to follow-up with outpatient services and that she has identified a support group. Pt actively participates within the milieu. Pt is negative for any SI/HI/AVH.  A: Writer administered scheduled and prn medications to pt. Continued support and availability as needed was extended to this pt. Staff continue to monitor pt with q4515min checks.  R: No adverse drug reactions noted. Pt receptive to treatment. Pt remains safe at this time.

## 2014-12-17 NOTE — [facility] Suicide Risk Assessment (Addendum)
[facility] Discharge Suicide Risk Assessment   Demographic Factors:  24 year old single female, lives with fiance, employed   Total Time spent with patient: 30 minutes  Musculoskeletal: Strength & Muscle Tone: within normal limits Gait & Station: normal Patient leans: N/A  Psychiatric Specialty Exam: Physical Exam  ROS  Blood pressure 118/63, pulse 130, temperature 98.1 F (36.7 C), temperature source Oral, resp. rate 16, height 5' 4.25" (1.632 m), weight 144 lb (65.318 kg), last menstrual period 12/06/2014, SpO2 100 %.Body mass index is 24.52 kg/(m^2).  General Appearance: Well Groomed  Patent attorney::  Good  Speech:  Normal Rate409  Volume:  Normal  Mood:  Euthymic  Affect:  Congruent  Thought Process:  Linear  Orientation:  Full (Time, Place, and Person)  Thought Content:  no hallucinations, no delusions  Suicidal Thoughts:  No- denies any thoughts of wanting to hurt herself or anyone else   Homicidal Thoughts:  No  Memory:  Recent and Remote grossly intact  Judgement:  Other:  improved   Insight:  Good  Psychomotor Activity:  Normal  Concentration:  Good  Recall:  Good  Fund of Knowledge:Good  Language: Good  Akathisia:  Negative  Handed:  Right  AIMS (if indicated):     Assets:  Communication Skills Desire for Improvement Resilience Social Support  Sleep:  Number of Hours: 6  Cognition: WNL  ADL's: improved    Have you used any form of tobacco in the last 30 days? (Cigarettes, Smokeless Tobacco, Cigars, and/or Pipes): Yes  Has this patient used any form of tobacco in the last 30 days? (Cigarettes, Smokeless Tobacco, Cigars, and/or Pipes) we discussed benefits of smoking cessation and at her request will prescribe nicoderm patches upoon discharge   Mental Status Per Nursing Assessment::   On Admission:  Self-harm behaviors  Current Mental Status by Physician: At this time Etheleen is improved compared to admission, her mood is better, her affect is brighter, she  denies depression, and has no suicidal or self injurious ideations, no HI, no psychotic symptoms. Not currently on any psychiatric medications  Loss Factors: Grandmother's death last week.   Historical Factors: No prior psychiatric admissions, no prior suicide attempts  Risk Reduction Factors:   Sense of responsibility to family, Living with another person, especially a relative and Positive coping skills or problem solving skills  Continued Clinical Symptoms:  At this time much improved , mood improved, affect fuller in range  , no SI or self injurious ideations, behavior has been calm and in good control. Regarding forearm/wrist cut, it remains bandaged, no severe pain, no indication of infection or bleeding, and preserved finger sensation/movement. Of note, pulse has been elevated at times, which is felt to be related to anxiety when vitals being taken - EKG was done , HR 77.   Cognitive Features That Contribute To Risk:  No gross cognitive deficits noted upon discharge. Is alert , attentive, and oriented x 3   Suicide Risk:  Mild:  Suicidal ideation of limited frequency, intensity, duration, and specificity.  There are no identifiable plans, no associated intent, mild dysphoria and related symptoms, good self-control (both objective and subjective assessment), few other risk factors, and identifiable protective factors, including available and accessible social support.  Principal Problem: Suicide and self-inflicted injury by cutting and piercing instrument Discharge Diagnoses:  Patient Active Problem List   Diagnosis Date Noted  . Suicide and self-inflicted injury by cutting and piercing instrument [X78.9XXA] 12/13/2014  . Major depression, single episode [F32.9]  12/13/2014    Class: Acute    Follow-up Information    Follow up with [facility] On 12/24/2014.   Why:  Assessment for therapy services on Thursday April 7th from 3 to 4:30 pm. Please call  office if you need to reschedule appointment.   Contact information:   [facility] [address] [place], [place] [address] [phone]      Plan Of Care/Follow-up recommendations:  Activity:  as tolerated  Diet:  Regular ( allergic to eggs )  Tests:  NA Other:  See below  Is patient on multiple antipsychotic therapies at discharge:  No   Has Patient had three or more failed trials of antipsychotic monotherapy by history:  No  Recommended Plan for Multiple Antipsychotic Therapies: NA   Patient is leaving in good spirits. Plans to return home. Follow up as above . States she has an appointment with Hand Surgeon next week for follow up, suture management. Encouraged to follow up with PCP regarding slightly abnormal Thyroid tests ( patient asymptomatic)  .      Kathleena Freeman 12/17/2014, 9:29 AM

## 2014-12-17 NOTE — Progress Notes (Signed)
  [facility]  Will you be returning to the same living situation after discharge:  Yes,  patient plans to return home At discharge, do you have transportation home?: Yes,  patient reports access to transportation by family Do you have the ability to pay for your medications: Yes,  patient will be provided with any necessary medication samples and prescriptions  Release of information consent forms completed and in the chart;  Patient's signature needed at discharge.  Patient to Follow up at: Follow-up Information    Follow up with [facility] On 12/24/2014.   Why:  Assessment for therapy services on Thursday April 7th from 3 to 4:30 pm. Please call office if you need to reschedule appointment.   Contact information:   [facility] [address] [place], [place] [address] [phone]      Patient denies SI/HI: Yes,  denies    Safety Planning and Suicide Prevention discussed: Yes,  with patient and fiance  Have you used any form of tobacco in the last 30 days? (Cigarettes, Smokeless Tobacco, Cigars, and/or Pipes): Yes  Has patient been referred to the Quitline?: Yes, faxed on 12/16/14  Luverne Zerkle, [place]Kristin L 12/17/2014, 9:51 AM

## 2014-12-17 NOTE — Progress Notes (Signed)
Patient ID: Kayla RicherCaitlin Whetsel, female   DOB: 29-Sep-1990, 24 y.o.   MRN: [id number] Patient discharged per physician order; patient denies SI/HI and A/V hallucinations; patient received samples, prescriptions, and copy of AVS after it was reviewed; patient had no other questions or concerns at this time; patient verbalized and signed that she received all belongings; patient left the unit ambulatory

## 2014-12-17 NOTE — [facility] Group Notes (Signed)
The focus of this group is to educate the patient on the purpose and policies of crisis stabilization and provide a format to answer questions about their admission.  The group details unit policies and expectations of patients while admitted. Patient attended this group and was engaging. 

## 2014-12-17 NOTE — [facility] Group Notes (Signed)
[facility] LCSW Group Therapy 12/17/2014 1:15 PM Type of Therapy: Group Therapy Participation Level: Active  Participation Quality: Attentive, Sharing and Supportive  Affect: Appropriate  Cognitive: Alert and Oriented  Insight: Developing/Improving and Engaged  Engagement in Therapy: Developing/Improving and Engaged  Modes of Intervention: Activity, Clarification, Confrontation, Discussion, Education, Exploration, Limit-setting, Orientation, Problem-solving, Rapport Building, Dance movement psychotherapisteality Testing, Socialization and Support  Summary of Progress/Problems: Patient was attentive and engaged with speaker from [employer]. Patient was attentive to speaker while they shared their story of dealing with mental health and overcoming it. Patient expressed interest in their programs and services and received information on their agency. Patient processed ways they can relate to the speaker. Patient discharged home halfway through presentation.  Samuella BruinKristin Deshonda Cryderman, MSW, [employer]CSWA Clinical Social Worker [facility] [phone]
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
## 2014-12-21 NOTE — Progress Notes (Signed)
Patient Discharge Instructions:  After Visit Summary (AVS):   Faxed to:  12/21/14 Discharge Summary Note:   Faxed to:  12/21/14 Psychiatric Admission Assessment Note:   Faxed to:  12/21/14 Suicide Risk Assessment - Discharge Assessment:   Faxed to:  12/21/14 Faxed/Sent to the Next Level Care provider:  12/21/14 Faxed to [facility] @ [phone]  Jerelene ReddenSheena E [place], 12/21/2014, 2:40 PM

## 2015-09-19 NOTE — L&D Delivery Note (Signed)
Delivery Note Pt progressed to complete and pushed for about an hour At 11:08 PM a viable female was delivered via Vaginal, Spontaneous Delivery (Presentation:OA ;  ).  APGAR: 8, 10; weight pending  .   Placenta status: delivered intact, shultz, .  Cord:  with the following complications: nuchal x 1 reduced over infants' head at perineum .   Anesthesia:  Episiotomy; 1% lidocaine Episiotomy: None Lacerations: Vaginal b/l Suture Repair: vicryl rapide 4-0 Est. Blood Loss (mL): 300  Mom to postpartum.  Baby to Couplet care / Skin to Skin.  Mare LoanCecilia Worema Leida Luton 08/16/2016, 11:46 PM

## 2016-07-17 LAB — OB RESULTS CONSOLE RUBELLA ANTIBODY, IGM: RUBELLA: IMMUNE

## 2016-07-17 LAB — OB RESULTS CONSOLE ABO/RH: RH Type: NEGATIVE

## 2016-07-17 LAB — OB RESULTS CONSOLE HEPATITIS B SURFACE ANTIGEN: HEP B S AG: NEGATIVE

## 2016-07-17 LAB — OB RESULTS CONSOLE RPR: RPR: NONREACTIVE

## 2016-07-17 LAB — OB RESULTS CONSOLE ANTIBODY SCREEN: Antibody Screen: NEGATIVE

## 2016-07-17 LAB — OB RESULTS CONSOLE HIV ANTIBODY (ROUTINE TESTING): HIV: NONREACTIVE

## 2016-07-18 LAB — OB RESULTS CONSOLE GBS: STREP GROUP B AG: POSITIVE

## 2016-08-15 ENCOUNTER — Encounter (HOSPITAL_COMMUNITY): Payer: Self-pay | Admitting: *Deleted

## 2016-08-15 ENCOUNTER — Telehealth (HOSPITAL_COMMUNITY): Payer: Self-pay | Admitting: *Deleted

## 2016-08-15 NOTE — Telephone Encounter (Signed)
Preadmission screen  

## 2016-08-16 ENCOUNTER — Encounter (HOSPITAL_COMMUNITY): Payer: Self-pay | Admitting: *Deleted

## 2016-08-16 ENCOUNTER — Inpatient Hospital Stay (HOSPITAL_COMMUNITY): Payer: Managed Care, Other (non HMO) | Admitting: Anesthesiology

## 2016-08-16 ENCOUNTER — Inpatient Hospital Stay (HOSPITAL_COMMUNITY)
Admission: AD | Admit: 2016-08-16 | Discharge: 2016-08-18 | DRG: 775 | Disposition: A | Discharge status: Home / Self Care | Payer: Managed Care, Other (non HMO) | Source: Ambulatory Visit | Attending: Obstetrics and Gynecology | Admitting: Obstetrics and Gynecology

## 2016-08-16 DIAGNOSIS — Z87891 Personal history of nicotine dependence: Secondary | ICD-10-CM

## 2016-08-16 DIAGNOSIS — Z3490 Encounter for supervision of normal pregnancy, unspecified, unspecified trimester: Secondary | ICD-10-CM

## 2016-08-16 DIAGNOSIS — Z8249 Family history of ischemic heart disease and other diseases of the circulatory system: Secondary | ICD-10-CM

## 2016-08-16 DIAGNOSIS — O99824 Streptococcus B carrier state complicating childbirth: Secondary | ICD-10-CM | POA: Diagnosis present

## 2016-08-16 DIAGNOSIS — Z833 Family history of diabetes mellitus: Secondary | ICD-10-CM

## 2016-08-16 DIAGNOSIS — Z3A40 40 weeks gestation of pregnancy: Secondary | ICD-10-CM | POA: Diagnosis not present

## 2016-08-16 DIAGNOSIS — Z3493 Encounter for supervision of normal pregnancy, unspecified, third trimester: Secondary | ICD-10-CM | POA: Diagnosis present

## 2016-08-16 HISTORY — DX: Mental disorder, not otherwise specified: F99

## 2016-08-16 LAB — TYPE AND SCREEN
ABO/RH(D): A NEG
Antibody Screen: NEGATIVE

## 2016-08-16 LAB — CBC
HEMATOCRIT: 32.4 % — AB (ref 36.0–46.0)
Hemoglobin: 11.1 g/dL — ABNORMAL LOW (ref 12.0–15.0)
MCH: 29.6 pg (ref 26.0–34.0)
MCHC: 34.3 g/dL (ref 30.0–36.0)
MCV: 86.4 fL (ref 78.0–100.0)
PLATELETS: 229 10*3/uL (ref 150–400)
RBC: 3.75 MIL/uL — ABNORMAL LOW (ref 3.87–5.11)
RDW: 13.7 % (ref 11.5–15.5)
WBC: 14 10*3/uL — AB (ref 4.0–10.5)

## 2016-08-16 LAB — OB RESULTS CONSOLE GC/CHLAMYDIA
Chlamydia: NEGATIVE
Gonorrhea: NEGATIVE

## 2016-08-16 MED ORDER — LIDOCAINE HCL (PF) 1 % IJ SOLN
30.0000 mL | INTRAMUSCULAR | Status: DC | PRN
  Administered 2016-08-16: 30 mL via SUBCUTANEOUS
  Filled 2016-08-16: qty 30

## 2016-08-16 MED ORDER — PENICILLIN G POT IN DEXTROSE 60000 UNIT/ML IV SOLN
3.0000 10*6.[IU] | INTRAVENOUS | Status: DC
  Filled 2016-08-16: qty 50

## 2016-08-16 MED ORDER — LACTATED RINGERS IV SOLN
500.0000 mL | Freq: Once | INTRAVENOUS | Status: AC
  Administered 2016-08-16: 500 mL via INTRAVENOUS

## 2016-08-16 MED ORDER — ACETAMINOPHEN 325 MG PO TABS: 650.0000 mg | ORAL_TABLET | ORAL | Status: DC | PRN

## 2016-08-16 MED ORDER — OXYTOCIN 40 UNITS IN LACTATED RINGERS INFUSION - SIMPLE MED
2.5000 [IU]/h | INTRAVENOUS | Status: DC
  Administered 2016-08-16: 2.5 [IU]/h via INTRAVENOUS
  Filled 2016-08-16: qty 1000

## 2016-08-16 MED ORDER — DEXTROSE 5 % IV SOLN
5.0000 10*6.[IU] | Freq: Once | INTRAVENOUS | Status: AC
  Administered 2016-08-16: 5 10*6.[IU] via INTRAVENOUS
  Filled 2016-08-16: qty 5

## 2016-08-16 MED ORDER — FENTANYL 2.5 MCG/ML BUPIVACAINE 1/10 % EPIDURAL INFUSION (WH - ANES)
14.0000 mL/h | INTRAMUSCULAR | Status: DC | PRN
  Administered 2016-08-16 (×2): 14 mL/h via EPIDURAL
  Filled 2016-08-16 (×2): qty 100

## 2016-08-16 MED ORDER — LACTATED RINGERS IV SOLN: 500.0000 mL | INTRAVENOUS | Status: DC | PRN

## 2016-08-16 MED ORDER — PHENYLEPHRINE 40 MCG/ML (10ML) SYRINGE FOR IV PUSH (FOR BLOOD PRESSURE SUPPORT)
80.0000 ug | PREFILLED_SYRINGE | INTRAVENOUS | Status: DC | PRN
  Filled 2016-08-16: qty 5

## 2016-08-16 MED ORDER — DIPHENHYDRAMINE HCL 50 MG/ML IJ SOLN: 12.5000 mg | INTRAMUSCULAR | Status: DC | PRN

## 2016-08-16 MED ORDER — ONDANSETRON HCL 4 MG/2ML IJ SOLN: 4.0000 mg | Freq: Four times a day (QID) | INTRAMUSCULAR | Status: DC | PRN

## 2016-08-16 MED ORDER — OXYTOCIN BOLUS FROM INFUSION
500.0000 mL | Freq: Once | INTRAVENOUS | Status: AC
  Administered 2016-08-16: 500 mL via INTRAVENOUS

## 2016-08-16 MED ORDER — PENICILLIN G POT IN DEXTROSE 60000 UNIT/ML IV SOLN
3.0000 10*6.[IU] | INTRAVENOUS | Status: DC
  Administered 2016-08-16 (×2): 3 10*6.[IU] via INTRAVENOUS
  Filled 2016-08-16 (×10): qty 50

## 2016-08-16 MED ORDER — LIDOCAINE HCL (PF) 1 % IJ SOLN
INTRAMUSCULAR | Status: DC | PRN
  Administered 2016-08-16: 3 mL via EPIDURAL
  Administered 2016-08-16: 2 mL via EPIDURAL
  Administered 2016-08-16: 5 mL via EPIDURAL

## 2016-08-16 MED ORDER — EPHEDRINE 5 MG/ML INJ
10.0000 mg | INTRAVENOUS | Status: DC | PRN
  Filled 2016-08-16: qty 4

## 2016-08-16 MED ORDER — PENICILLIN G POTASSIUM 5000000 UNITS IJ SOLR
2.5000 10*6.[IU] | INTRAVENOUS | Status: DC
  Filled 2016-08-16 (×3): qty 2.5

## 2016-08-16 MED ORDER — PHENYLEPHRINE 40 MCG/ML (10ML) SYRINGE FOR IV PUSH (FOR BLOOD PRESSURE SUPPORT)
80.0000 ug | PREFILLED_SYRINGE | INTRAVENOUS | Status: DC | PRN
  Filled 2016-08-16: qty 10
  Filled 2016-08-16: qty 5

## 2016-08-16 MED ORDER — LACTATED RINGERS IV SOLN
500.0000 mL | Freq: Once | INTRAVENOUS | Status: DC
Start: 2016-08-16 — End: 2016-08-17

## 2016-08-16 MED ORDER — SOD CITRATE-CITRIC ACID 500-334 MG/5ML PO SOLN: 30.0000 mL | ORAL | Status: DC | PRN

## 2016-08-16 MED ORDER — LACTATED RINGERS IV SOLN
INTRAVENOUS | Status: DC
  Administered 2016-08-16 (×2): via INTRAVENOUS

## 2016-08-16 NOTE — Anesthesia Procedure Notes (Signed)
Epidural Patient location during procedure: OB  Staffing Anesthesiologist: TURK, STEPHEN EDWARD Performed: anesthesiologist   Preanesthetic Checklist Completed: patient identified, pre-op evaluation, timeout performed, IV checked, risks and benefits discussed and monitors and equipment checked  Epidural Patient position: sitting Prep: DuraPrep Patient monitoring: blood pressure and continuous pulse ox Approach: midline Location: L3-L4 Injection technique: LOR air  Needle:  Needle type: Tuohy  Needle gauge: 17 G Needle length: 9 cm Needle insertion depth: 5 cm Catheter size: 19 Gauge Catheter at skin depth: 10 cm Test dose: negative and Other (1% Lidocaine)  Additional Notes Patient identified.  Risk benefits discussed including failed block, incomplete pain control, headache, nerve damage, paralysis, blood pressure changes, nausea, vomiting, reactions to medication both toxic or allergic, and postpartum back pain.  Patient expressed understanding and wished to proceed.  All questions were answered.  Sterile technique used throughout procedure and epidural site dressed with sterile barrier dressing. No paresthesia or other complications noted. The patient did not experience any signs of intravascular injection such as tinnitus or metallic taste in mouth nor signs of intrathecal spread such as rapid motor block. Please see nursing notes for vital signs. Reason for block:procedure for pain     

## 2016-08-16 NOTE — Progress Notes (Signed)
Patient ID: Zella RicherCaitlin Cirrincione, female   DOB: 16-Dec-1990, 25 y.o.   MRN: [id number] Pt comfortable with epidural; tried nitrous before epid Has no complaints VSS EFM - 140s, cat 1 TOCO - contractions q 2mins SVE -  5.5cm dil per nurse exam an hour ago   A/P: Essential prime at  7840 1/7wks progressing in labor         Fluid noted now clear - no mec         Comfortable with epidural          Anticipate svd          S/p PCN x 1 for GBS

## 2016-08-16 NOTE — Anesthesia Preprocedure Evaluation (Signed)
Anesthesia Evaluation  Patient identified by MRN, date of birth, ID band Patient awake    Reviewed: Allergy & Precautions, NPO status , Patient's Chart, lab work & pertinent test results  Airway Mallampati: II  TM Distance: >3 FB Neck ROM: Full    Dental  (+) Teeth Intact, Dental Advisory Given   Pulmonary asthma , former smoker,    Pulmonary exam normal breath sounds clear to auscultation       Cardiovascular negative cardio ROS Normal cardiovascular exam Rhythm:Regular Rate:Normal     Neuro/Psych PSYCHIATRIC DISORDERS Depression History of suicidal ideationnegative neurological ROS     GI/Hepatic negative GI ROS,   Endo/Other  negative endocrine ROSObesity   Renal/GU negative Renal ROS     Musculoskeletal negative musculoskeletal ROS (+)   Abdominal   Peds  Hematology  (+) Blood dyscrasia, anemia , Plt 229k   Anesthesia Other Findings Day of surgery medications reviewed with the patient.  Reproductive/Obstetrics (+) Pregnancy                             Anesthesia Physical Anesthesia Plan  ASA: II  Anesthesia Plan: Epidural   Post-op Pain Management:    Induction:   Airway Management Planned:   Additional Equipment:   Intra-op Plan:   Post-operative Plan:   Informed Consent: I have reviewed the patients History and Physical, chart, labs and discussed the procedure including the risks, benefits and alternatives for the proposed anesthesia with the patient or authorized representative who has indicated his/her understanding and acceptance.   Dental advisory given  Plan Discussed with:   Anesthesia Plan Comments: (Patient identified. Risks/Benefits/Options discussed with patient including but not limited to bleeding, infection, nerve damage, paralysis, failed block, incomplete pain control, headache, blood pressure changes, nausea, vomiting, reactions to medication both or  allergic, itching and postpartum back pain. Confirmed with bedside nurse the patient's most recent platelet count. Confirmed with patient that they are not currently taking any anticoagulation, have any bleeding history or any family history of bleeding disorders. Patient expressed understanding and wished to proceed. All questions were answered. )        Anesthesia Quick Evaluation

## 2016-08-16 NOTE — H&P (Signed)
Kayla Yates is a 25 y.o. female presenting for admission for active labor. Pt reports contractions that begun at 3am today./ Noted to be 4cm dil in office. Pt had limited prenatal care after late entry in care.  No complaints during her care. She is GBS positive. OB History    Gravida Para Term Preterm AB Living   2       1     SAB TAB Ectopic Multiple Live Births   1             Past Medical History:  Diagnosis Date  . Asthma   . Complication of anesthesia    hives  . Mental disorder   . Suicide and self-inflicted injury by cutting and piercing instrument (HCC)    2016   Past Surgical History:  Procedure Laterality Date  . ANKLE SURGERY    . DILATION AND CURETTAGE OF UTERUS    . INCISION AND DRAINAGE OF WOUND Left 12/13/2014   Procedure: IRRIGATION AND DEBRIDEMENT  WRIST, EXPLORATION OF MEDIAL NERVE AND RADIAL ARTERY, REPAIR OF FCR, PL, & FCU.;  Surgeon: Knute NeuHarrill Coley, MD;  Location: [facility];  Service: Plastics;  Laterality: Left;  . TONSILLECTOMY    . WRIST ARTHROSCOPY     Family History: family history includes Arthritis in her maternal grandmother; Diabetes in her maternal grandmother; Hypertension in her mother. Social History:  reports that she quit smoking about 8 months ago. Her smoking use included Cigarettes. She has never used smokeless tobacco. She reports that she drinks alcohol. She reports that she does not use drugs.     Maternal Diabetes: No Genetic Screening: Declined Maternal Ultrasounds/Referrals: Normal Fetal Ultrasounds or other Referrals:  None Maternal Substance Abuse:  No Significant Maternal Medications:  None Significant Maternal Lab Results:  Lab values include: Group B Strep positive Other Comments:  None  Review of Systems  Constitutional: Positive for malaise/fatigue. Negative for chills, fever and weight loss.  Eyes: Negative for blurred vision.  Respiratory: Positive for shortness of breath.   Cardiovascular: Negative for chest pain.   Gastrointestinal: Positive for abdominal pain. Negative for heartburn, nausea and vomiting.  Genitourinary: Negative for dysuria.  Musculoskeletal: Positive for back pain.  Skin: Negative for itching and rash.  Neurological: Negative for dizziness and headaches.  Endo/Heme/Allergies: Does not bruise/bleed easily.  Psychiatric/Behavioral: Negative for depression, hallucinations, substance abuse and suicidal ideas. The patient is not nervous/anxious.    Maternal Medical History:  Reason for admission: Contractions.  Nausea.  Contractions: Onset was 6-12 hours ago.   Frequency: regular.   Perceived severity is strong.    Fetal activity: Perceived fetal activity is normal.   Last perceived fetal movement was within the past hour.    Prenatal complications: Late to care  Prenatal Complications - Diabetes: none.    Dilation: 4 Effacement (%): 100 Station: -2 Exam by:: Dr. Mindi SlickerBanga Blood pressure 127/76, pulse 99, temperature 98 F (36.7 C), temperature source Oral, resp. rate 16, height 5\' 6"  (1.676 m), weight 196 lb (88.9 kg). Maternal Exam:  Uterine Assessment: Contraction strength is moderate.  Contraction frequency is regular.   Abdomen: Patient reports generalized tenderness.  Estimated fetal weight is AGA.   Fetal presentation: vertex  Introitus: Normal vulva. Normal vagina.  Pelvis: adequate for delivery.   Cervix: Cervix evaluated by digital exam.     Physical Exam  Constitutional: She is oriented to person, place, and time. She appears well-developed and well-nourished.  Neck: Normal range of motion.  Cardiovascular: Normal  rate.   Respiratory: Effort normal.  GI: Soft. There is generalized tenderness.  Genitourinary: Vagina normal and uterus normal.  Musculoskeletal: Normal range of motion.  Neurological: She is alert and oriented to person, place, and time.  Skin: Skin is warm.  Psychiatric: She has a normal mood and affect. Her behavior is normal. Judgment and  thought content normal.    Prenatal labs: ABO, Rh: A/Negative/-- (10/30 0000) Antibody: Negative (10/30 0000) Rubella: Immune (10/30 0000) RPR: Nonreactive (10/30 0000)  HBsAg: Negative (10/30 0000)  HIV: Non-reactive (10/30 0000)  GBS: Positive (10/31 0000)   Assessment/Plan: G2P0010 at 40 1/[redacted]wks gestation for active labor management  AROM with moderate return of light meconium stained fluid - explained implications Expectant mgmt  Will not desires circ if has a boy Pain control prn  Kayla Yates 08/16/2016, 2:01 PM

## 2016-08-16 NOTE — Anesthesia Pain Management Evaluation Note (Signed)
  CRNA Pain Management Visit Note  Patient: Kayla Yates, 25 y.o., female  "Hello I am a [facility] of the anesthesia [facility]. We have an anesthesia team available at all times to provide care throughout the hospital, including epidural management and anesthesia for C-section. I don't know your plan for the delivery whether it a natural birth, water birth, IV sedation, nitrous supplementation, doula or epidural, but we want to meet your pain goals."   1.Was your pain managed to your expectations on prior hospitalizations?   No prior hospitalizations  2.What is your expectation for pain management during this hospitalization?     Labor support without medications  3.How can we help you reach that goal? Pt desires natural labor but was opne to discussion about pain management.  Record the patient's initial score and the patient's pain goal.   Pain: 7  Pain Goal: [address] [facility] wants you to be able to say your pain was always managed very well.  Gad Aymond 08/16/2016

## 2016-08-17 ENCOUNTER — Encounter (HOSPITAL_COMMUNITY): Payer: Self-pay

## 2016-08-17 LAB — CBC
HEMATOCRIT: 25.1 % — AB (ref 36.0–46.0)
HEMOGLOBIN: 9 g/dL — AB (ref 12.0–15.0)
MCH: 30.8 pg (ref 26.0–34.0)
MCHC: 35.9 g/dL (ref 30.0–36.0)
MCV: 86 fL (ref 78.0–100.0)
Platelets: 189 10*3/uL (ref 150–400)
RBC: 2.92 MIL/uL — ABNORMAL LOW (ref 3.87–5.11)
RDW: 13.8 % (ref 11.5–15.5)
WBC: 14.3 10*3/uL — AB (ref 4.0–10.5)

## 2016-08-17 LAB — ABO/RH: ABO/RH(D): A NEG

## 2016-08-17 LAB — RPR: RPR Ser Ql: NONREACTIVE

## 2016-08-17 MED ORDER — PRENATAL MULTIVITAMIN CH
1.0000 | ORAL_TABLET | Freq: Every day | ORAL | Status: DC
  Administered 2016-08-17 – 2016-08-18 (×2): 1 via ORAL
  Filled 2016-08-17 (×2): qty 1

## 2016-08-17 MED ORDER — DIPHENHYDRAMINE HCL 25 MG PO CAPS: 25.0000 mg | ORAL_CAPSULE | Freq: Four times a day (QID) | ORAL | Status: DC | PRN

## 2016-08-17 MED ORDER — SIMETHICONE 80 MG PO CHEW: 80.0000 mg | CHEWABLE_TABLET | ORAL | Status: DC | PRN

## 2016-08-17 MED ORDER — ONDANSETRON HCL 4 MG/2ML IJ SOLN: 4.0000 mg | INTRAMUSCULAR | Status: DC | PRN

## 2016-08-17 MED ORDER — SENNOSIDES-DOCUSATE SODIUM 8.6-50 MG PO TABS
2.0000 | ORAL_TABLET | ORAL | Status: DC
  Administered 2016-08-18: 2 via ORAL
  Filled 2016-08-17: qty 2

## 2016-08-17 MED ORDER — RHO D IMMUNE GLOBULIN 1500 UNIT/2ML IJ SOSY
300.0000 ug | PREFILLED_SYRINGE | Freq: Once | INTRAMUSCULAR | Status: AC
Start: 2016-08-17 — End: 2016-08-17
  Administered 2016-08-17: 300 ug via INTRAVENOUS
  Filled 2016-08-17: qty 2

## 2016-08-17 MED ORDER — DIBUCAINE 1 % RE OINT: 1.0000 "application " | TOPICAL_OINTMENT | RECTAL | Status: DC | PRN

## 2016-08-17 MED ORDER — BENZOCAINE-MENTHOL 20-0.5 % EX AERO
1.0000 "application " | INHALATION_SPRAY | CUTANEOUS | Status: DC | PRN
  Filled 2016-08-17: qty 56

## 2016-08-17 MED ORDER — ACETAMINOPHEN 325 MG PO TABS: 650.0000 mg | ORAL_TABLET | ORAL | Status: DC | PRN

## 2016-08-17 MED ORDER — IBUPROFEN 600 MG PO TABS
600.0000 mg | ORAL_TABLET | Freq: Four times a day (QID) | ORAL | Status: DC
  Administered 2016-08-17 – 2016-08-18 (×7): 600 mg via ORAL
  Filled 2016-08-17 (×7): qty 1

## 2016-08-17 MED ORDER — COCONUT OIL OIL
1.0000 "application " | TOPICAL_OIL | Status: DC | PRN
  Administered 2016-08-18: 1 via TOPICAL
  Filled 2016-08-17: qty 120

## 2016-08-17 MED ORDER — ONDANSETRON HCL 4 MG PO TABS: 4.0000 mg | ORAL_TABLET | ORAL | Status: DC | PRN

## 2016-08-17 MED ORDER — ZOLPIDEM TARTRATE 5 MG PO TABS: 5.0000 mg | ORAL_TABLET | Freq: Every evening | ORAL | Status: DC | PRN

## 2016-08-17 MED ORDER — WITCH HAZEL-GLYCERIN EX PADS: 1.0000 "application " | MEDICATED_PAD | CUTANEOUS | Status: DC | PRN

## 2016-08-17 MED ORDER — TETANUS-DIPHTH-ACELL PERTUSSIS 5-2.5-18.5 LF-MCG/0.5 IM SUSP: 0.5000 mL | Freq: Once | INTRAMUSCULAR | Status: DC

## 2016-08-17 NOTE — Plan of Care (Signed)
Problem: Safety: Goal: Ability to remain free from injury will improve Outcome: Progressing Pt encouraged to call when getting up for the first time.  Pt verbalized understanding.

## 2016-08-17 NOTE — Lactation Note (Signed)
This note was copied from a baby's chart. Lactation Consultation Note Initial visit at 12 hours of age.  Mom reports good feedings, baby is latched now and denies pain with feedings.  Baby in latched to left breast with neck turned, LC assisted with rotating baby belly to belly with mom.  Baby needing some stimulation to maintain feedings.  Baby slipped to shallow latch, LC instructed on breaking latch to remove baby and noted nipple to be slightly compressed. LC assisted with waiting for wide open mouth to latch deeply.  Baby has strong rhythmic sucking and few swallows audible.  LC discussed good jaw movement to identify good feedings, mom to keep baby active at breast during feedings.  [facility] resources given and discussed.  Encouraged to feed with early cues on demand.  Early newborn behavior discussed.  Mom may need additional teaching on hand expression.   Mom to call for assist as needed.    Patient Name: Kayla Yates UJWJX'BToday's Date: 08/17/2016 Reason for consult: Initial assessment   Maternal Data Has patient been taught Hand Expression?: Yes Does the patient have breastfeeding experience prior to this delivery?: No  Feeding Feeding Type: Breast Fed Length of feed: 20 min  LATCH Score/Interventions Latch: Repeated attempts needed to sustain latch, nipple held in mouth throughout feeding, stimulation needed to elicit sucking reflex. Intervention(s): Skin to skin;Teach feeding cues;Waking techniques Intervention(s): Breast massage;Breast compression  Audible Swallowing: A few with stimulation Intervention(s): Hand expression;Alternate breast massage  Type of Nipple: Everted at rest and after stimulation  Comfort (Breast/Nipple): Soft / non-tender     Hold (Positioning): Assistance needed to correctly position infant at breast and maintain latch. Intervention(s): Breastfeeding basics reviewed;Support Pillows;Position options;Skin to skin  LATCH Score: 7  Lactation Tools  Discussed/Used     Consult Status Consult Status: Follow-up Date: 08/18/16 Follow-up type: In-patient    [name], Jana Lynn 08/17/2016, 12:04 PM

## 2016-08-17 NOTE — Progress Notes (Signed)
Post Partum Day 1 Subjective: no complaints and tolerating PO, sleepy but pain minimal  Objective: Blood pressure (!) 105/46, pulse 79, temperature 98.6 F (37 C), temperature source Axillary, resp. rate 16, height 5\' 6"  (1.676 m), weight 88.9 kg (196 lb), SpO2 97 %, unknown if currently breastfeeding.  Physical Exam:  General: alert and cooperative Lochia: appropriate Uterine Fundus: firm    Recent Labs  08/16/16 1245 08/17/16 0601  HGB 11.1* 9.0*  HCT 32.4* 25.1*    Assessment/Plan: Plan for discharge tomorrow   LOS: 1 day   Blessing Ozga W 08/17/2016, 8:36 AM

## 2016-08-17 NOTE — Anesthesia Postprocedure Evaluation (Signed)
Anesthesia Post Note  Patient: Kayla RicherCaitlin Yates  Procedure(s) Performed: * No procedures listed *  Patient location during evaluation: Mother Baby Anesthesia Type: Epidural Level of consciousness: awake and alert, oriented and patient cooperative Pain management: pain level controlled Vital Signs Assessment: post-procedure vital signs reviewed and stable Respiratory status: spontaneous breathing Cardiovascular status: stable Postop Assessment: no headache, epidural receding, patient able to bend at knees and no signs of nausea or vomiting Anesthetic complications: no Comments: Pain score 2.     Last Vitals:  Vitals:   08/17/16 0200 08/17/16 0600  BP: (!) 118/59 (!) 105/46  Pulse: 96 79  Resp: 16 16  Temp: 37.2 C 37 C    Last Pain:  Vitals:   08/17/16 0715  TempSrc:   PainSc: Asleep   Pain Goal: Patients Stated Pain Goal: 0 (08/17/16 0600)               Merrilyn PumaWRINKLE,Namita Yearwood

## 2016-08-17 NOTE — Clinical Social Work Maternal (Signed)
  CLINICAL SOCIAL WORK MATERNAL/CHILD NOTE  Patient Details  Name: Kayla Yates MRN: [id number] Date of Birth: 11/16/1990  Date:  March 23, 2016  Clinical Social Worker Initiating Note:  Laurey Arrow Date/ Time Initiated:  Feb 16, 2016/1103     Child's Name:  Sol Blazing   Legal Guardian:  Mother   Need for Interpreter:  None   Date of Referral:  08/11/2016     Reason for Referral:  Behavioral Health Issues, including SI    Referral Source:      Address:  [address] [place] [address]  Phone number:  [phone]   Household Members:  Self, Spouse   Natural Supports (not living in the home):  Parent, Friends (MOB's and FOB's mothers)   Professional Supports: None   Employment: Part-time   Type of Work: Field seismologist:  Database administrator Resources:  Multimedia programmer   Other Resources:      Cultural/Religious Considerations Which May Impact Care:  None Reported  Strengths:  Ability to meet basic needs , Home prepared for child    Risk Factors/Current Problems:  Mental Health Concerns    Cognitive State:  Alert , Able to Concentrate , Linear Thinking    Mood/Affect:  Bright , Happy , Interested , Comfortable    CSW Assessment: CSW met with MOB to complete an assessment for a consult for MOB's MH hx.   MOB was inviting, polite, and interested in meeting with CSW. MOB gave CSW permission to meet with MOB while FOB/Husband Landry Mellow) was present. CSW inquired about MOB's supports, and MOB stated that MOB and family will be supported by MOB's and FOB's parents along with other family members and friends. CSW inquired about MOB's MH hx.  MOB acknowledged episodes of depression and attributed it to grief and loss of MOB's grandmother (death date Dec 31, 2014) and MOB's grandfather (death date 2016/05/01).  MOB reported feelings of sadness and SI.  MOB denied any current or recent SI and HI.  MOB also denied a medication regiment. CSW  educated MOB about PPD. CSW informed MOB of possible supports and interventions to decrease PPD.  CSW also encouraged MOB to seek medical attention if needed for increased signs, symptoms for PPD. CSW offered MOB resources for community resources and MOB declined. MOB communicated that MOB is currently an established patient at [facility], and MOB will reach out to [facility] if a need arise. CSW reviewed safe sleep, and SIDS. MOB was knowledgeable and asked appropriate questions. MOB communicated that she has a crib for the baby and feels excited about being a new mother. CSW thanked MOB for her willingness to meet with CSW.  MOB did not have any further questions, concerns, or needs at this time.   CSW Plan/Description:  Information/Referral to [employer] , No Further Intervention Required/No Barriers to Discharge, Patient/Family Education     Laurey Arrow, MSW, [employer] Clinical Social Work [phone]    Dimple Nanas, LCSW July 25, 2016, 11:06 AM

## 2016-08-18 LAB — RH IG WORKUP (INCLUDES ABO/RH)
ABO/RH(D): A NEG
FETAL SCREEN: NEGATIVE
Gestational Age(Wks): 40
UNIT DIVISION: 0

## 2016-08-18 MED ORDER — IBUPROFEN 800 MG PO TABS: 800.0000 mg | ORAL_TABLET | Freq: Three times a day (TID) | ORAL | 1 refills | Status: DC | PRN

## 2016-08-18 MED ORDER — PRENATAL MULTIVITAMIN CH: 1.0000 | ORAL_TABLET | Freq: Every day | ORAL | 3 refills | Status: AC

## 2016-08-18 NOTE — Discharge Summary (Signed)
OB Discharge Summary     Patient Name: Kayla RicherCaitlin Yates DOB: Dec 18, 1990 MRN: [id number]  Date of admission: 08/16/2016 Delivering MD: Pryor OchoaBANGA, CECILIA [facility]   Date of discharge: 08/18/2016  Admitting diagnosis: LABOR Intrauterine pregnancy: 7333w1d     Secondary diagnosis:  Active Problems:   Term pregnancy   SVD (spontaneous vaginal delivery)   Postpartum care following vaginal delivery  Additional problems: N/A     Discharge diagnosis: Term Pregnancy Delivered                                                                                                Post partum procedures:rhogam  Augmentation: AROM and Pitocin  Complications: None  Hospital course:  Onset of Labor With Vaginal Delivery     25 y.o. yo G2P1011 at 4333w1d was admitted in Active Labor on 08/16/2016. Patient had an uncomplicated labor course as follows:  Membrane Rupture Time/Date: 1:37 PM ,08/16/2016   Intrapartum Procedures: Episiotomy: None [1]                                         Lacerations:  Vaginal [6]  Patient had a delivery of a Viable infant. 08/16/2016  Information for the patient's newborn:  Duanne MoronDavis, Girl Snow [[id number]][[id number]]  Delivery Method: Vaginal, Spontaneous Delivery (Filed from Delivery Summary)    Pateint had an uncomplicated postpartum course.  She is ambulating, tolerating a regular diet, passing flatus, and urinating well. Patient is discharged home in stable condition on 08/18/16.    Physical exam Vitals:   08/17/16 0200 08/17/16 0600 08/17/16 1927 08/18/16 0611  BP: (!) 118/59 (!) 105/46 116/62 (!) 101/57  Pulse: 96 79 81 79  Resp: 16 16 18 18   Temp: 99 F (37.2 C) 98.6 F (37 C) 98.5 F (36.9 C) 98 F (36.7 C)  TempSrc: Oral Axillary Axillary Oral  SpO2: 98% 97%    Weight:      Height:       General: alert and no distress Lochia: appropriate Uterine Fundus: firm  Labs: Lab Results  Component Value Date   WBC 14.3 (H) 08/17/2016   HGB 9.0 (L) 08/17/2016   HCT 25.1  (L) 08/17/2016   MCV 86.0 08/17/2016   PLT 189 08/17/2016   CMP Latest Ref Rng & Units 12/15/2014  Glucose 70 - 99 mg/dL 086(V100(H)  BUN 6 - 23 mg/dL 6  Creatinine 7.840.50 - 6.961.10 mg/dL 2.950.73  Sodium 284135 - 132145 mmol/L 139  Potassium 3.5 - 5.1 mmol/L 3.9  Chloride 96 - 112 mmol/L 108  CO2 19 - 32 mmol/L 21  Calcium 8.4 - 10.5 mg/dL 44.010.1  Total Protein 6.0 - 8.3 g/dL -  Total Bilirubin 0.3 - 1.2 mg/dL -  Alkaline Phos 39 - 102117 U/L -  AST 0 - 37 U/L -  ALT 0 - 35 U/L -    Discharge instruction: per After Visit Summary and "Baby and Me Booklet".  After visit meds:    Medication List    STOP taking these  medications   cephALEXin 500 MG capsule Commonly known as:  KEFLEX   nicotine polacrilex 2 MG gum Commonly known as:  NICORETTE   traZODone 50 MG tablet Commonly known as:  DESYREL     TAKE these medications   ibuprofen 800 MG tablet Commonly known as:  ADVIL,MOTRIN Take 1 tablet (800 mg total) by mouth every 8 (eight) hours as needed.   prenatal multivitamin Tabs tablet Take 1 tablet by mouth daily at 12 noon.       Diet: routine diet  Activity: Advance as tolerated. Pelvic rest for 6 weeks.   Outpatient follow up:6 weeks Follow up Appt:Future Appointments Date Time Provider Department Center  08/21/2016 7:00 AM [facility]ED ROOM [facility] None   Follow up Visit:No Follow-up on file.  Postpartum contraception: Undecided  Newborn Data: Live born female  Birth Weight: 7 lb 1.4 oz (3215 g) APGAR: 8, 10  Baby Feeding: Breast Disposition:home with mother   08/18/2016 Sherian ReinBovard-Stuckert, Oria Klimas, MD

## 2016-08-18 NOTE — Progress Notes (Signed)
Post Partum Day 2 Subjective: no complaints, up ad lib, voiding, tolerating PO and nl lochia, pain controlled  Objective: Blood pressure (!) 101/57, pulse 79, temperature 98 F (36.7 C), temperature source Oral, resp. rate 18, height 5\' 6"  (1.676 m), weight 88.9 kg (196 lb), SpO2 97 %, unknown if currently breastfeeding.  Physical Exam:  General: alert and no distress Lochia: appropriate Uterine Fundus: firm   Recent Labs  08/16/16 1245 08/17/16 0601  HGB 11.1* 9.0*  HCT 32.4* 25.1*    Assessment/Plan: Discharge home, Breastfeeding and Lactation consult.  Routine PP care.  D/c home with motrin and PNV.  F/u 6 weeks.   LOS: 2 days   Bovard-Stuckert, Lynnex Fulp 08/18/2016, 8:30 AM

## 2016-08-18 NOTE — Lactation Note (Addendum)
This note was copied from a baby's chart. Lactation Consultation Note  Patient Name: [name] [id number]'s Date: 08/18/2016 Reason for consult: Follow-up assessment   Follow up assessment with first time mom of 34 hour old infant. Infant with 11 BF for 10-30 minutes, 1 attempt, 3 voids and 4 stools in 24 hours preceding this assessment. Infant weight 6 lb 13.7 oz with weight loss of 3% since birth. LATCH Scores 7-10.  Infant asleep in mom's arms, mom reports she recently fed. Mom reports she is concerned infant is not getting enough since she ate a lot last night. Discussed normalcy of cluster feeding, I/O and milk coming to volume. Mom reports her breasts feel fuller today. When asked if she is experiencing any nipple pain, mom replied "not really". Discussed nipple care of EBM and coconut or olive oil. Mom reports she has been using coconut oil to nipples.   Reviewed all BF information in Taking Care of Baby and Me Booklet. Reviewed positioning, BF basics, engorgement prevention/treatment, Breast milk storage, thawing and handling. Discussed introducing bottles at around 4-6 weeks. Mom has a Medela PIS for home use. Reviewed LC Brochure, reviewed OP Services, BF Support Groups and LC phone #. Enc mom to call with questions/concerns and to attend BFSG.   Mom without questions/concerns at this time. Infant has f/u on Monday with Ped.    Maternal Data Has patient been taught Hand Expression?: Yes Does the patient have breastfeeding experience prior to this delivery?: No  Feeding    LATCH Score/Interventions                      Lactation Tools Discussed/Used WIC Program: No Pump Review: Milk Storage   Consult Status Consult Status: Complete Follow-up type: Call as needed    Ed BlalockSharon S Terissa Haffey 08/18/2016, 10:23 AM

## 2016-08-21 ENCOUNTER — Inpatient Hospital Stay (HOSPITAL_COMMUNITY)
Admission: RE | Admit: 2016-08-21 | Discharge: 2016-08-21 | Disposition: A | Discharge status: Home / Self Care | Payer: Managed Care, Other (non HMO) | Source: Ambulatory Visit | Attending: Obstetrics and Gynecology | Admitting: Obstetrics and Gynecology

## 2019-09-19 NOTE — L&D Delivery Note (Addendum)
I was called to come in for a delivery, patient presented to MAU, with urge to push and when I was called, provider in MAU- CNM Reita Cliche was in her room and patient was pushing. Me and my CNM both drove in ASAP but arrived to MAU to find patient having SVD and immediate delivery of her placenta.  I took over care, assessed perineum, small superficial inner labial laceration noted without any depth or bleeding;. Pt counseled on placing a single stitch each to help with burning sensation vs using Tuck's spray, ice to allow healing, she chose latter.  Uterus cleared of clots, EBL 600 cc (bag full of amniotic fluid - SROM in MAU with blood)  Mom and baby stable, skin to skin. FOB in room.   V.Kamil Mchaffie MD

## 2019-09-19 NOTE — L&D Delivery Note (Signed)
LABOR COURSE Patient presented to MAU in active labor. Almost immediately after her placement in her exam room she began involuntarily pushing and guarding her perineum. She delivered spontaneously a short time later  She receives prenatal care with [facility].  Delivery Note Called to room and patient was complete, grunting and guarding her perineum with her hands. Head delivered ROT. Loose nuchal cord present x 1 loop, delivered through. Shoulder and body delivered in usual fashion. At 1919 a viable female was delivered via Vaginal, Spontaneous (Presentation: ROT; ROA).  Infant with spontaneous cry, placed on mother's abdomen, dried and stimulated. Cord clamped x 2 after two-minute delay. Cord pale and pulseless prior to cutting, cut by FOB. Cord blood drawn. Placenta delivered spontaneously with maternal effort, no traction applies. Appears intact. Fundus firm with massage and IM Pitocin.   Dr. Juliene Pina at bedside shortly after delivery of placenta. See separate note for her examination of perineum  APGAR:8, 9; weight: 3510g  .   Cord: 3VC with the following complications:N/A Cord pH: N/A  Anesthesia: None Episiotomy: None Lacerations: See assessment by Dr. Juliene Pina Est. Blood Loss (mL):  Mom to postpartum.  Baby to Couplet care / Skin to Skin.   Clayton Bibles, MSN, CNM Certified Nurse Midwife, [facility] for [employer], [facility] 12/15/19 9:29 PM

## 2019-12-15 ENCOUNTER — Encounter (HOSPITAL_COMMUNITY): Payer: Self-pay | Admitting: Obstetrics & Gynecology

## 2019-12-15 ENCOUNTER — Other Ambulatory Visit: Payer: Self-pay

## 2019-12-15 ENCOUNTER — Inpatient Hospital Stay (HOSPITAL_COMMUNITY)
Admission: AD | Admit: 2019-12-15 | Discharge: 2019-12-17 | DRG: 806 | Disposition: A | Discharge status: Home / Self Care | Payer: Managed Care, Other (non HMO) | Attending: Obstetrics & Gynecology | Admitting: Obstetrics & Gynecology

## 2019-12-15 DIAGNOSIS — Z20822 Contact with and (suspected) exposure to covid-19: Secondary | ICD-10-CM | POA: Diagnosis present

## 2019-12-15 DIAGNOSIS — O26893 Other specified pregnancy related conditions, third trimester: Secondary | ICD-10-CM | POA: Diagnosis present

## 2019-12-15 DIAGNOSIS — O99824 Streptococcus B carrier state complicating childbirth: Secondary | ICD-10-CM | POA: Diagnosis present

## 2019-12-15 DIAGNOSIS — Z283 Underimmunization status: Secondary | ICD-10-CM

## 2019-12-15 DIAGNOSIS — Z6791 Unspecified blood type, Rh negative: Secondary | ICD-10-CM

## 2019-12-15 DIAGNOSIS — Z87891 Personal history of nicotine dependence: Secondary | ICD-10-CM | POA: Diagnosis not present

## 2019-12-15 DIAGNOSIS — O26899 Other specified pregnancy related conditions, unspecified trimester: Secondary | ICD-10-CM

## 2019-12-15 DIAGNOSIS — O99892 Other specified diseases and conditions complicating childbirth: Secondary | ICD-10-CM

## 2019-12-15 DIAGNOSIS — Z3A38 38 weeks gestation of pregnancy: Secondary | ICD-10-CM

## 2019-12-15 DIAGNOSIS — Z2839 Other underimmunization status: Secondary | ICD-10-CM

## 2019-12-15 MED ORDER — DIBUCAINE (PERIANAL) 1 % EX OINT: 1.0000 "application " | TOPICAL_OINTMENT | CUTANEOUS | Status: DC | PRN

## 2019-12-15 MED ORDER — PRENATAL MULTIVITAMIN CH
1.0000 | ORAL_TABLET | Freq: Every day | ORAL | Status: DC
  Administered 2019-12-16 – 2019-12-17 (×2): 1 via ORAL
  Filled 2019-12-15 (×2): qty 1

## 2019-12-15 MED ORDER — ONDANSETRON HCL 4 MG PO TABS: 4.0000 mg | ORAL_TABLET | ORAL | Status: DC | PRN

## 2019-12-15 MED ORDER — SENNOSIDES-DOCUSATE SODIUM 8.6-50 MG PO TABS
2.0000 | ORAL_TABLET | ORAL | Status: DC
  Administered 2019-12-15 – 2019-12-16 (×2): 2 via ORAL
  Filled 2019-12-15 (×2): qty 2

## 2019-12-15 MED ORDER — ACETAMINOPHEN 325 MG PO TABS
650.0000 mg | ORAL_TABLET | ORAL | Status: DC | PRN
  Filled 2019-12-15: qty 2

## 2019-12-15 MED ORDER — SIMETHICONE 80 MG PO CHEW: 80.0000 mg | CHEWABLE_TABLET | ORAL | Status: DC | PRN

## 2019-12-15 MED ORDER — OXYTOCIN 10 UNIT/ML IJ SOLN
INTRAMUSCULAR | Status: AC
  Administered 2019-12-15: 19:00:00 10 [IU] via INTRAMUSCULAR
  Filled 2019-12-15: qty 1

## 2019-12-15 MED ORDER — KETOROLAC TROMETHAMINE 60 MG/2ML IM SOLN: 30.0000 mg | Freq: Once | INTRAMUSCULAR | Status: AC

## 2019-12-15 MED ORDER — WITCH HAZEL-GLYCERIN EX PADS: 1.0000 "application " | MEDICATED_PAD | CUTANEOUS | Status: DC | PRN

## 2019-12-15 MED ORDER — DIPHENHYDRAMINE HCL 25 MG PO CAPS: 25.0000 mg | ORAL_CAPSULE | Freq: Four times a day (QID) | ORAL | Status: DC | PRN

## 2019-12-15 MED ORDER — IBUPROFEN 600 MG PO TABS
600.0000 mg | ORAL_TABLET | Freq: Four times a day (QID) | ORAL | Status: DC
  Administered 2019-12-16 – 2019-12-17 (×5): 600 mg via ORAL
  Filled 2019-12-15 (×6): qty 1

## 2019-12-15 MED ORDER — IBUPROFEN 600 MG PO TABS: 600.0000 mg | ORAL_TABLET | Freq: Four times a day (QID) | ORAL | Status: DC

## 2019-12-15 MED ORDER — COCONUT OIL OIL: 1.0000 "application " | TOPICAL_OIL | Status: DC | PRN

## 2019-12-15 MED ORDER — TETANUS-DIPHTH-ACELL PERTUSSIS 5-2.5-18.5 LF-MCG/0.5 IM SUSP: 0.5000 mL | Freq: Once | INTRAMUSCULAR | Status: DC

## 2019-12-15 MED ORDER — ZOLPIDEM TARTRATE 5 MG PO TABS: 5.0000 mg | ORAL_TABLET | Freq: Every evening | ORAL | Status: DC | PRN

## 2019-12-15 MED ORDER — ONDANSETRON HCL 4 MG/2ML IJ SOLN: 4.0000 mg | INTRAMUSCULAR | Status: DC | PRN

## 2019-12-15 MED ORDER — BENZOCAINE-MENTHOL 20-0.5 % EX AERO: 1.0000 "application " | INHALATION_SPRAY | CUTANEOUS | Status: DC | PRN

## 2019-12-15 MED ORDER — KETOROLAC TROMETHAMINE 30 MG/ML IJ SOLN
INTRAMUSCULAR | Status: AC
  Administered 2019-12-15: 20:00:00 30 mg via INTRAMUSCULAR
  Filled 2019-12-15: qty 1

## 2019-12-15 NOTE — MAU Note (Signed)
Pt reports to MAU with contractions, pulled right back, feels like pushing. Feels movement, no bleeding, no leaking.

## 2019-12-15 NOTE — H&P (Addendum)
Kayla Yates is a 29 y.o. female G3P1011, 38.5 wks by [facility] 12/24/19, presenting in active labor, pushing involuntarily, guarding perineum as "baby is coming".  She came and immediately delivered in MAU as soon as placed in a room, delivery assisted by MAU CNM [place]. Placenta delivered soon after.  Pt reported UCs x 2 hours and some vaginal bleeding en route to hospital, SROM in MAU.   PNCare since 1st trim/ Dr Garwin Brothers.  A(-) Rub Non-immune GBS(+) -- no antibiotic before delivery due to precipitous labor.  H/o situational depression, denies being on medication in last few yrs or needing any therapy  OB History    Gravida  3   Para  1   Term  1   Preterm      AB  1   Living  1     SAB  1   TAB      Ectopic      Multiple  0   Live Births  1          Past Medical History:  Diagnosis Date  . Asthma   . Complication of anesthesia    hives  . Mental disorder   . Suicide and self-inflicted injury by cutting and piercing instrument ([place])    2016   Past Surgical History:  Procedure Laterality Date  . ANKLE SURGERY    . DILATION AND CURETTAGE OF UTERUS    . INCISION AND DRAINAGE OF WOUND Left 12/13/2014   Procedure: IRRIGATION AND DEBRIDEMENT  WRIST, EXPLORATION OF MEDIAL NERVE AND RADIAL ARTERY, REPAIR OF FCR, PL, & FCU.;  Surgeon: [name], MD;  Location: [place];  Service: Plastics;  Laterality: Left;  . TONSILLECTOMY    . WRIST ARTHROSCOPY     Family History: family history includes Arthritis in her maternal grandmother; Diabetes in her maternal grandmother; Hypertension in her mother. Social History:  reports that she quit smoking about 4 years ago. Her smoking use included cigarettes. She has never used smokeless tobacco. She reports current alcohol use. She reports that she does not use drugs.     Maternal Diabetes: No Genetic Screening: Declined Maternal Ultrasounds/Referrals: Normal Fetal Ultrasounds or other Referrals:  None Maternal Substance Abuse:   No Significant Maternal Medications:  None Significant Maternal Lab Results:  Group B Strep positive, Rh negative. Other Comments:  Rapid delivery in MAU with No GBS prophylaxis   Review of Systems History   unknown if currently breastfeeding. Exam Physical Exam  There were no vitals taken for this visit. Physical exam:  A&O x 3, no acute distress. Pleasant HEENT neg, no thyromegaly Lungs CTA bilat CV RRR, S1S2 normal Abdo soft, non tender, non acute, uterus firm, well contracted, 1 cm above U Extr no edema/ tenderness Pelvic perineal exam with superficial scratch like tears without depth or bleeding, so repair deferred    Prenatal labs: ABO, Rh:  A(-) Antibody:  neg Rubella:  NON- immune RPR:   NR HBsAg:   Neg HIV:   Neg GBS:   POSITIVE Glucola nl   Assessment/Plan: 29 yo, G2P2012, postpartum with precipitous delivery in MAU No GBS prophylaxis, advised Peds will not allow early discharge  Routine PP care Watch for PP depression with remote H/o depression Rh neg, Rhogam work-up Rub Non-immune--> needs MMR before discharge   Kayla Yates 12/15/2019, 7:57 PM

## 2019-12-16 ENCOUNTER — Encounter (HOSPITAL_COMMUNITY): Payer: Self-pay | Admitting: Obstetrics & Gynecology

## 2019-12-16 LAB — SARS CORONAVIRUS 2 (TAT 6-24 HRS): SARS Coronavirus 2: NEGATIVE

## 2019-12-16 LAB — CBC
HCT: 30 % — ABNORMAL LOW (ref 36.0–46.0)
Hemoglobin: 10.2 g/dL — ABNORMAL LOW (ref 12.0–15.0)
MCH: 29.8 pg (ref 26.0–34.0)
MCHC: 34 g/dL (ref 30.0–36.0)
MCV: 87.7 fL (ref 80.0–100.0)
Platelets: 234 10*3/uL (ref 150–400)
RBC: 3.42 MIL/uL — ABNORMAL LOW (ref 3.87–5.11)
RDW: 13.4 % (ref 11.5–15.5)
WBC: 12.7 10*3/uL — ABNORMAL HIGH (ref 4.0–10.5)
nRBC: 0 % (ref 0.0–0.2)

## 2019-12-16 LAB — TYPE AND SCREEN
ABO/RH(D): A NEG
Antibody Screen: POSITIVE

## 2019-12-16 MED ORDER — POLYSACCHARIDE IRON COMPLEX 150 MG PO CAPS
150.0000 mg | ORAL_CAPSULE | Freq: Every day | ORAL | Status: DC
  Administered 2019-12-16 – 2019-12-17 (×2): 150 mg via ORAL
  Filled 2019-12-16 (×2): qty 1

## 2019-12-16 MED ORDER — CALCIUM CARBONATE ANTACID 500 MG PO CHEW
2.0000 | CHEWABLE_TABLET | Freq: Three times a day (TID) | ORAL | Status: DC
  Administered 2019-12-16 (×2): 400 mg via ORAL
  Filled 2019-12-16 (×2): qty 2

## 2019-12-16 MED ORDER — MEASLES, MUMPS & RUBELLA VAC IJ SOLR
0.5000 mL | Freq: Once | INTRAMUSCULAR | Status: AC
  Administered 2019-12-17: 12:00:00 0.5 mL via SUBCUTANEOUS
  Filled 2019-12-16: qty 0.5

## 2019-12-16 MED ORDER — MEASLES, MUMPS & RUBELLA VAC IJ SOLR: 0.5000 mL | Freq: Once | INTRAMUSCULAR | Status: DC

## 2019-12-16 MED ORDER — MAGNESIUM OXIDE 400 (241.3 MG) MG PO TABS
400.0000 mg | ORAL_TABLET | Freq: Every day | ORAL | Status: DC
  Administered 2019-12-16 – 2019-12-17 (×2): 400 mg via ORAL
  Filled 2019-12-16 (×2): qty 1

## 2019-12-16 NOTE — Progress Notes (Signed)
MOB was referred for history of depression/anxiety. * Referral screened out by Clinical Social Worker because none of the following criteria appear to apply: ~ History of anxiety/depression during this pregnancy, or of post-partum depression following prior delivery. ~ Diagnosis of anxiety and/or depression within last 3 years. Per further chart review, MOB diagnosed with  depression in 2016.  OR * MOB's symptoms currently being treated with medication and/or therapy.    Please contact the Clinical Social Worker if needs arise, by MOB request, or if MOB scores greater than 9/yes to question 10 on Edinburgh Postpartum Depression Screen.    Belky Mundo S. Eustolia Drennen, MSW, LCSW [facility] [phone]   

## 2019-12-16 NOTE — Lactation Note (Signed)
This note was copied from a baby's chart. Lactation Consultation Note  Patient Name: Kayla Yates [id number] Date: 12/16/2019 Reason for consult: Initial assessment;Early term 37-38.6wks P2, 5 hour ETI infant, infant with bruised face (blue). Mom was changing stool ( meconium) when LC entered the room. Per mom, she has medela DEBP at home only needs new parts, LC gave mom DEBP kit for home use. Per mom, she feels infant is latching well,  Infant breastfed for 20 minutes in L&D and 12 am for 30 minutes.  Mom doesn't have any questions or concerns at this time for [facility] services. Mom is experienced at breastfeeding, she breastfed her 14 year old daughter for 15 months. Mom knows to breastfed infant according to hunger cues, 8 to 12 times within 24 hours and not exceed 3 hours without feeding infant. Reviewed Baby & Me book's Breastfeeding Basics.  Mom made aware of O/P services, breastfeeding support groups, community resources, and our phone # for post-discharge questions.   Maternal Data Formula Feeding for Exclusion: No Has patient been taught Hand Expression?: Yes Does the patient have breastfeeding experience prior to this delivery?: Yes  Feeding Feeding Type: Breast Fed  LATCH Score                   Interventions Interventions: Breast feeding basics reviewed;Hand express;Skin to skin  Lactation Tools Discussed/Used WIC Program: No   Consult Status Consult Status: Follow-up Date: 12/16/19 Follow-up type: In-patient    Vicente Serene 12/16/2019, 1:10 AM

## 2019-12-16 NOTE — Progress Notes (Addendum)
PPD #1, SVD, precipitous delivery, baby boy "Colter"  S:  Reports feeling great, much better than last delivery. Denies pain.              Tolerating po/ No nausea or vomiting / Denies dizziness or SOB             Bleeding is light             Up ad lib / ambulatory / voiding QS without difficulty   Newborn breast feeding - going well  / Circumcision - declines  O:               VS: BP 124/74 (BP Location: Left Arm)   Pulse 76   Temp 98.7 F (37.1 C) (Oral)   Resp 18   SpO2 99%   Breastfeeding Unknown  Patient Vitals for the past 24 hrs:  BP Temp Temp src Pulse Resp SpO2  12/16/19 0830 124/74 98.7 F (37.1 C) Oral 76 18 99 %  12/16/19 0611 124/72 97.6 F (36.4 C) Oral (!) 56 20 100 %  12/16/19 0207 114/74 97.6 F (36.4 C) Oral 64 20 100 %  12/15/19 2206 101/68 98.1 F (36.7 C) Oral 68 20 100 %  12/15/19 2053 (!) 122/94 98 F (36.7 C) Oral 96 20 100 %  12/15/19 2022 105/72 -- -- 81 -- --  12/15/19 2016 (!) 116/91 -- -- -- -- --  12/15/19 2001 101/65 -- -- 87 -- 99 %  12/15/19 1949 130/78 98.2 F (36.8 C) Oral 81 18 100 %    LABS:              Recent Labs    12/16/19 0511  WBC 12.7*  HGB 10.2*  PLT 234               Blood type:    Rubella:        Non-immune               I&O: Intake/Output      03/29 0701 - 03/30 0700 03/30 0701 - 03/31 0700   Blood 900    Total Output 900    Net -900                       Physical Exam:             Alert and oriented X3  Lungs: Clear and unlabored  Heart: regular rate and rhythm / no murmurs  Abdomen: soft, non-tender, non-distended              Fundus: firm, non-tender, U-1  Perineum: intact, no edema  Lochia: small rubra on pad   Extremities: no edema, no calf pain or tenderness    A/P: PPD # 1, SVD  Precipitous delivery    - GBS positive, not treated  S/p PPH   - Begin Niferex 146m PO daily   - Magnesium oxide 4047mdaily  RH Negative   - baby RH negative, no Rhogam indicated  Rubella non-immune   - offer  MMR vaccine on day of discharge   Hx. Of situational depression     - monitor for s/s of PPD  Type and Screen and RPR added  Doing well - stable status  Routine post partum orders  Lactation support  Anticipate d/c home tomorrow   MeLars PinksMSN, CNM [facility]

## 2019-12-17 LAB — RPR: RPR Ser Ql: NONREACTIVE

## 2019-12-17 MED ORDER — MAGNESIUM OXIDE 400 (241.3 MG) MG PO TABS: 400.0000 mg | ORAL_TABLET | Freq: Every day | ORAL | Status: AC

## 2019-12-17 MED ORDER — IBUPROFEN 600 MG PO TABS: 600.0000 mg | ORAL_TABLET | Freq: Four times a day (QID) | ORAL | 0 refills | Status: AC

## 2019-12-17 MED ORDER — ACETAMINOPHEN 500 MG PO TABS: 1000.0000 mg | ORAL_TABLET | Freq: Four times a day (QID) | ORAL | 2 refills | Status: AC | PRN

## 2019-12-17 MED ORDER — BENZOCAINE-MENTHOL 20-0.5 % EX AERO: 1.0000 "application " | INHALATION_SPRAY | CUTANEOUS | Status: DC | PRN

## 2019-12-17 MED ORDER — COCONUT OIL OIL: 1.0000 "application " | TOPICAL_OIL | 0 refills | Status: AC | PRN

## 2019-12-17 MED ORDER — POLYSACCHARIDE IRON COMPLEX 150 MG PO CAPS: 150.0000 mg | ORAL_CAPSULE | Freq: Every day | ORAL | Status: AC

## 2019-12-17 NOTE — Discharge Summary (Signed)
OB Discharge Summary  Patient Name: Kayla Yates DOB: 1991/02/03 MRN: [id number]  Date of admission: 12/15/2019 Delivering provider: Mallie Snooks C  for Dr. Benjie Karvonen  Date of discharge: 12/17/2019  Admitting diagnosis: SVD (spontaneous vaginal delivery) [O80] Intrauterine pregnancy: [redacted]w[redacted]d     Secondary diagnosis:Principal Problem:   Postpartum care following vaginal delivery 3/29 Active Problems:   SVD (spontaneous vaginal delivery)   Rh negative, maternal   Rubella nonimmune status, delivered, current hospitalization  Additional problems:none     Discharge diagnosis:  Patient Active Problem List   Diagnosis Date Noted  . Rh negative, maternal 12/15/2019  . Rubella nonimmune status, delivered, current hospitalization 12/15/2019  . Postpartum care following vaginal delivery 3/29 08/17/2016  . SVD (spontaneous vaginal delivery) 08/16/2016  . Major depressive disorder, single episode, moderate ([place])   . Suicide and self-inflicted injury by cutting and piercing instrument ([place]) 12/13/2014  . Major depression, single episode 12/13/2014    Class: Acute                                                                Post partum procedures:none  Augmentation: none Pain control: None  Laceration:None  Episiotomy:None  Complications: precipitous delivery and mild postpartum hemorrhage  Hospital course:  Onset of Labor With Vaginal Delivery     29 y.o. yo [id number] at [redacted]w[redacted]d was admitted in Active Labor on 12/15/2019. Patient had a labor course as follows: contractions started couple hours prior to presenting to [facility]U with urge to bear down, newborn delivered with [facility]U provider in attendance, Dr. Benjie Karvonen in attendance immediately after delivery and resumed care. Membrane Rupture Time/Date: 7:10 PM ,12/15/2019   Intrapartum Procedures: Episiotomy: None [1]                                         Lacerations:  None [1]  Patient had a delivery of a Viable infant. 12/15/2019  Information for  the patient's newborn:  Arthur, Aydelotte [[id number]]  Delivery Method: Vaginal, Spontaneous(Filed from Delivery Summary)     Pateint had an uncomplicated postpartum course.  She is ambulating, tolerating a regular diet, passing flatus, and urinating well. Patient is discharged home in stable condition on 12/17/19.   Physical exam  Vitals:   12/16/19 0830 12/16/19 1437 12/16/19 2154 12/17/19 0554  BP: 124/74 130/81 114/71 114/66  Pulse: 76 80 82 79  Resp: 18  18 20   Temp: 98.7 F (37.1 C) 97.7 F (36.5 C) 97.9 F (36.6 C) 98.5 F (36.9 C)  TempSrc: Oral Oral Oral Oral  SpO2: 99%  98% 100%   General: alert, cooperative and no distress Lochia: appropriate Uterine Fundus: firm Incision: N/A DVT Evaluation: No cords or calf tenderness. No significant calf/ankle edema. Labs: Lab Results  Component Value Date   WBC 12.7 (H) 12/16/2019   HGB 10.2 (L) 12/16/2019   HCT 30.0 (L) 12/16/2019   MCV 87.7 12/16/2019   PLT 234 12/16/2019   CMP Latest Ref Rng & Units 12/15/2014  Glucose 70 - 99 mg/dL 100(H)  BUN 6 - 23 mg/dL 6  Creatinine 0.50 - 1.10 mg/dL 0.73  Sodium 135 - 145 mmol/L 139  Potassium 3.5 - 5.1 mmol/L 3.9  Chloride 96 - 112 mmol/L 108  CO2 19 - 32 mmol/L 21  Calcium 8.4 - 10.5 mg/dL 36.1  Total Protein 6.0 - 8.3 g/dL -  Total Bilirubin 0.3 - 1.2 mg/dL -  Alkaline Phos 39 - 443 U/L -  AST 0 - 37 U/L -  ALT 0 - 35 U/L -   Edinburgh Postnatal Depression Scale Screening Tool 12/17/2019 12/16/2019 12/16/2019 12/15/2019  I have been able to laugh and see the funny side of things. 0 (No Data) (No Data) (No Data)  I have looked forward with enjoyment to things. 0 - - -  I have blamed myself unnecessarily when things went wrong. 1 - - -  I have been anxious or worried for no good reason. 1 - - -  I have felt scared or panicky for no good reason. 0 - - -  Things have been getting on top of me. 0 - - -  I have been so unhappy that I have had difficulty sleeping. 0 - - -  I  have felt sad or miserable. 0 - - -  I have been so unhappy that I have been crying. 0 - - -  The thought of harming myself has occurred to me. 0 - - -  Edinburgh Postnatal Depression Scale Total 2 - - -   Vaccines: TDaP UTD         Flu    UTD  Discharge instruction: per After Visit Summary and "Baby and Me Booklet".  After Visit Meds:  Allergies as of 12/17/2019      Reactions   Codeine Itching   Patient states it is the codeine in cough syrup---severe stomach pains   Eggs Or Egg-derived Products Rash      Medication List    TAKE these medications   acetaminophen 500 MG tablet Commonly known as: TYLENOL Take 2 tablets (1,000 mg total) by mouth every 6 (six) hours as needed.   benzocaine-Menthol 20-0.5 % Aero Commonly known as: DERMOPLAST Apply 1 application topically as needed for irritation (perineal discomfort).   coconut oil Oil Apply 1 application topically as needed.   ibuprofen 600 MG tablet Commonly known as: ADVIL Take 1 tablet (600 mg total) by mouth every 6 (six) hours. What changed:   medication strength  how much to take  when to take this  reasons to take this   iron polysaccharides 150 MG capsule Commonly known as: Ferrex 150 Take 1 capsule (150 mg total) by mouth daily.   magnesium oxide 400 (241.3 Mg) MG tablet Commonly known as: MAG-OX Take 1 tablet (400 mg total) by mouth daily.   prenatal multivitamin Tabs tablet Take 1 tablet by mouth daily at 12 noon.            Discharge Care Instructions  (From admission, onward)         Start     Ordered   12/17/19 0000  Discharge wound care:    Comments: Sitz baths 2 times /day with warm water x 1 week. May add herbals: 1 ounce dried comfrey leaf* 1 ounce calendula flowers 1 ounce lavender flowers 1/2 ounce dried uva ursi leaves 1/2 ounce witch hazel blossoms (if you can find them) 1/2 ounce dried sage leaf 1/2 cup sea salt Directions: Bring 2 quarts of water to a boil. Turn off  heat, and place 1 ounce (approximately 1 large handful) of the above mixed herbs (not the salt) into the pot. Steep, covered, for 30 minutes.  Strain the liquid well with a fine mesh strainer, and discard the herb material. Add 2 quarts of liquid to the tub, along with the 1/2 cup of salt. This medicinal liquid can also be made into compresses and peri-rinses.   12/17/19 1050          Diet: routine diet  Activity: Advance as tolerated. Pelvic rest for 6 weeks.   Postpartum contraception: Not Discussed  Newborn Data: Live born female  Birth Weight: 7 lb 11.8 oz (3510 g) APGAR: 8, 9  Newborn Delivery   Birth date/time: 12/15/2019 19:19:00 Delivery type: Vaginal, Spontaneous      named [place] Baby Feeding: Breast Disposition:home with mother   Delivery Report:  Review the Delivery Report for details.    Follow up:     Signed: Neta Mends, CNM, MSN 12/17/2019, 10:51 AM

## 2021-03-29 ENCOUNTER — Ambulatory Visit: Payer: Managed Care, Other (non HMO) | Admitting: Nurse Practitioner

## 2021-04-12 ENCOUNTER — Ambulatory Visit (INDEPENDENT_AMBULATORY_CARE_PROVIDER_SITE_OTHER): Payer: Managed Care, Other (non HMO) | Admitting: Nurse Practitioner

## 2021-04-12 ENCOUNTER — Encounter: Payer: Self-pay | Admitting: Nurse Practitioner

## 2021-04-12 ENCOUNTER — Other Ambulatory Visit: Payer: Self-pay

## 2021-04-12 VITALS — BP 105/64 | HR 80 | Temp 99.4°F | Ht 66.0 in | Wt 168.7 lb

## 2021-04-12 DIAGNOSIS — J029 Acute pharyngitis, unspecified: Secondary | ICD-10-CM | POA: Diagnosis not present

## 2021-04-12 DIAGNOSIS — Z7689 Persons encountering health services in other specified circumstances: Secondary | ICD-10-CM

## 2021-04-12 NOTE — Progress Notes (Signed)
New Patient Office Visit  Subjective:  Patient ID: Kayla Yates, female    DOB: 09-11-1991  Age: 30 y.o. MRN: [id number]  CC:  Chief Complaint  Patient presents with   Establish Care     HPI Kayla Yates presents to establish new primary care provider.  She states that a few months ago she had a wisdom tooth removed from the left upper part of her jaw.  She had antibiotics prescribed.  She felt like she had 1 tablet that became lodged in her throat.  Has felt like that ever since.  Feels like of lump on the right side of her throat.  She feels it most often when she swallows.  She can also hear a clicking sound on the right side of the throat when swallowing.  She states the right side of her throat and neck are tender to palpate.  She denies fever, headache, or difficulty swallowing.  She denies nausea, vomiting, or diarrhea.  She denies currently having trouble swallowing or eating.  She denies other problems or concerns.  She denies chest pain, chest pressure, or shortness of breath. He denies headaches or visual disturbances. He denies abdominal pain, nausea, vomiting, or changes in bowel or bladder habits.    Past Medical History:  Diagnosis Date   Asthma    Complication of anesthesia    hives   Mental disorder    Suicide and self-inflicted injury by cutting and piercing instrument ([facility])    2016    Past Surgical History:  Procedure Laterality Date   ANKLE SURGERY     DILATION AND CURETTAGE OF UTERUS     INCISION AND DRAINAGE OF WOUND Left 12/13/2014   Procedure: IRRIGATION AND DEBRIDEMENT  WRIST, EXPLORATION OF MEDIAL NERVE AND RADIAL ARTERY, REPAIR OF FCR, PL, & FCU.;  Surgeon: Knute Neu, MD;  Location: [facility] OR;  Service: Plastics;  Laterality: Left;   TONSILLECTOMY     WRIST ARTHROSCOPY      Family History  Problem Relation Age of Onset   Hypertension Mother    Arthritis Maternal Grandmother    Diabetes Maternal Grandmother    Hearing loss Neg Hx    Early death  Neg Hx    Drug abuse Neg Hx    Heart disease Neg Hx    Depression Neg Hx    COPD Neg Hx    Cancer Neg Hx    Birth defects Neg Hx    Asthma Neg Hx    Alcohol abuse Neg Hx    Hyperlipidemia Neg Hx    Kidney disease Neg Hx    Learning disabilities Neg Hx    Mental illness Neg Hx    Mental retardation Neg Hx    Miscarriages / Stillbirths Neg Hx    Stroke Neg Hx    Vision loss Neg Hx    Varicose Veins Neg Hx     Social History   Socioeconomic History   Marital status: Married    Spouse name: Not on file   Number of children: Not on file   Years of education: Not on file   Highest education level: Not on file  Occupational History   Not on file  Tobacco Use   Smoking status: Former    Types: Cigarettes    Quit date: 12/14/2015    Years since quitting: 5.3   Smokeless tobacco: Never  Substance and Sexual Activity   Alcohol use: Yes    Comment: soc   Drug use: No  Sexual activity: Yes    Partners: Male    Birth control/protection: None  Other Topics Concern   Not on file  Social History Narrative   Not on file   Social Determinants of Health   Financial Resource Strain: Not on file  Food Insecurity: Not on file  Transportation Needs: Not on file  Physical Activity: Not on file  Stress: Not on file  Social Connections: Not on file  Intimate Partner Violence: Not on file    ROS Review of Systems  Constitutional:  Negative for activity change, chills, fatigue and fever.  HENT:  Negative for congestion, postnasal drip, rhinorrhea, sinus pressure, sinus pain and sore throat.        Feels like she has tablet or something stuck in the right side of her throat.  Eyes: Negative.   Respiratory:  Negative for cough, chest tightness, shortness of breath and wheezing.   Cardiovascular:  Negative for chest pain and palpitations.  Gastrointestinal:  Negative for constipation, diarrhea, nausea and vomiting.  Endocrine: Negative for cold intolerance, heat intolerance,  polydipsia and polyuria.  Musculoskeletal:  Negative for back pain, myalgias and neck stiffness.  Skin:  Negative for rash.  Allergic/Immunologic: Negative.   Neurological:  Negative for dizziness, weakness and headaches.  Psychiatric/Behavioral:  Negative for dysphoric mood. The patient is not nervous/anxious.    Objective:   Today's Vitals   04/12/21 1439  BP: 105/64  Pulse: 80  Temp: 99.4 F (37.4 C)  SpO2: 98%  Weight: 168 lb 11.2 oz (76.5 kg)  Height: 5\' 6"  (1.676 m)   Body mass index is 27.23 kg/m.   Physical Exam Vitals and nursing note reviewed.  Constitutional:      Appearance: Normal appearance. She is well-developed.  HENT:     Head: Normocephalic and atraumatic.     Right Ear: Ear canal and external ear normal.     Left Ear: Ear canal normal.     Nose: Nose normal.     Mouth/Throat:     Mouth: Mucous membranes are moist.     Pharynx: Oropharynx is clear.  Eyes:     Extraocular Movements: Extraocular movements intact.     Pupils: Pupils are equal, round, and reactive to light.  Cardiovascular:     Rate and Rhythm: Normal rate and regular rhythm.     Pulses: Normal pulses.     Heart sounds: Normal heart sounds.  Pulmonary:     Effort: Pulmonary effort is normal.     Breath sounds: Normal breath sounds.  Abdominal:     Palpations: Abdomen is soft.  Musculoskeletal:        General: Normal range of motion.     Cervical back: Normal range of motion and neck supple.  Skin:    General: Skin is warm and dry.     Capillary Refill: Capillary refill takes less than 2 seconds.  Neurological:     General: No focal deficit present.     Mental Status: She is alert and oriented to person, place, and time.  Psychiatric:        Mood and Affect: Mood normal.        Behavior: Behavior normal.        Thought Content: Thought content normal.        Judgment: Judgment normal.    Assessment & Plan:  1. Encounter to establish care Appointment today to establish new  primary care provider.  We will make next appointment for CPE.  She should  get fasting blood work with full thyroid panel checked prior to that visit.  2. Sore throat No visible or palpable abnormalities of the throat and neck.  Will have patient monitor symptoms.  Will consider referral to ENT if symptoms worsen or do not improve over next few months.  Problem List Items Addressed This Visit       Other   Encounter to establish care - Primary   Sore throat    Outpatient Encounter Medications as of 04/12/2021  Medication Sig   coconut oil OIL Apply 1 application topically as needed.   ibuprofen (ADVIL) 600 MG tablet Take 1 tablet (600 mg total) by mouth every 6 (six) hours.   iron polysaccharides (FERREX 150) 150 MG capsule Take 1 capsule (150 mg total) by mouth daily.   magnesium oxide (MAG-OX) 400 (241.3 Mg) MG tablet Take 1 tablet (400 mg total) by mouth daily.   Prenatal Vit-Fe Fumarate-FA (PRENATAL MULTIVITAMIN) TABS tablet Take 1 tablet by mouth daily at 12 noon. (Patient not taking: Reported on 04/12/2021)   [DISCONTINUED] benzocaine-Menthol (DERMOPLAST) 20-0.5 % AERO Apply 1 application topically as needed for irritation (perineal discomfort).   No facility-administered encounter medications on file as of 04/12/2021.    Follow-up: Return in about 2 months (around 06/13/2021) for health maintenance exam - patient can come next monday or tuesday for FBW adding Free T4 & vit d.   This note was dictated using Conservation officer, historic buildings. Rapid proofreading was performed to expedite the delivery of the information. Despite proofreading, phonetic errors will occur which are common with this voice recognition software. Please take this into consideration. If there are any concerns, please contact our office.    Carlean Jews, NP

## 2021-04-19 ENCOUNTER — Other Ambulatory Visit: Payer: Self-pay

## 2021-04-19 ENCOUNTER — Other Ambulatory Visit: Payer: Managed Care, Other (non HMO)

## 2021-04-19 DIAGNOSIS — Z0000 Encounter for general adult medical examination without abnormal findings: Secondary | ICD-10-CM

## 2021-04-20 LAB — COMPREHENSIVE METABOLIC PANEL
ALT: 10 IU/L (ref 0–32)
AST: 14 IU/L (ref 0–40)
Albumin/Globulin Ratio: 2.9 — ABNORMAL HIGH (ref 1.2–2.2)
Albumin: 5.3 g/dL — ABNORMAL HIGH (ref 3.9–5.0)
Alkaline Phosphatase: 49 IU/L (ref 44–121)
BUN/Creatinine Ratio: 14 (ref 9–23)
BUN: 11 mg/dL (ref 6–20)
Bilirubin Total: 0.4 mg/dL (ref 0.0–1.2)
CO2: 19 mmol/L — ABNORMAL LOW (ref 20–29)
Calcium: 9.8 mg/dL (ref 8.7–10.2)
Chloride: 99 mmol/L (ref 96–106)
Creatinine, Ser: 0.8 mg/dL (ref 0.57–1.00)
Globulin, Total: 1.8 g/dL (ref 1.5–4.5)
Glucose: 82 mg/dL (ref 65–99)
Potassium: 3.5 mmol/L (ref 3.5–5.2)
Sodium: 138 mmol/L (ref 134–144)
Total Protein: 7.1 g/dL (ref 6.0–8.5)
eGFR: 102 mL/min/{1.73_m2} (ref >59)

## 2021-04-20 LAB — LIPID PANEL
Chol/HDL Ratio: 3.6 ratio (ref 0.0–4.4)
Cholesterol, Total: 169 mg/dL (ref 100–199)
HDL: 47 mg/dL (ref >39)
LDL Chol Calc (NIH): 107 mg/dL — ABNORMAL HIGH (ref 0–99)
Triglycerides: 79 mg/dL (ref 0–149)
VLDL Cholesterol Cal: 15 mg/dL (ref 5–40)

## 2021-04-20 LAB — CBC WITH DIFFERENTIAL/PLATELET
Basophils Absolute: 0 10*3/uL (ref 0.0–0.2)
Basos: 1 %
EOS (ABSOLUTE): 0.3 10*3/uL (ref 0.0–0.4)
Eos: 5 %
Hematocrit: 41.9 % (ref 34.0–46.6)
Hemoglobin: 14 g/dL (ref 11.1–15.9)
Immature Grans (Abs): 0 10*3/uL (ref 0.0–0.1)
Immature Granulocytes: 0 %
Lymphocytes Absolute: 2.2 10*3/uL (ref 0.7–3.1)
Lymphs: 40 %
MCH: 29.6 pg (ref 26.6–33.0)
MCHC: 33.4 g/dL (ref 31.5–35.7)
MCV: 89 fL (ref 79–97)
Monocytes Absolute: 0.5 10*3/uL (ref 0.1–0.9)
Monocytes: 10 %
Neutrophils Absolute: 2.5 10*3/uL (ref 1.4–7.0)
Neutrophils: 44 %
Platelets: 254 10*3/uL (ref 150–450)
RBC: 4.73 x10E6/uL (ref 3.77–5.28)
RDW: 13.5 % (ref 11.7–15.4)
WBC: 5.5 10*3/uL (ref 3.4–10.8)

## 2021-04-20 LAB — T4, FREE: Free T4: 1.31 ng/dL (ref 0.82–1.77)

## 2021-04-20 LAB — VITAMIN D 25 HYDROXY (VIT D DEFICIENCY, FRACTURES): Vit D, 25-Hydroxy: 27.1 ng/mL — ABNORMAL LOW (ref 30.0–100.0)

## 2021-04-20 LAB — HEMOGLOBIN A1C
Est. average glucose Bld gHb Est-mCnc: 103 mg/dL
Hgb A1c MFr Bld: 5.2 % (ref 4.8–5.6)

## 2021-04-20 LAB — TSH: TSH: 2.5 u[IU]/mL (ref 0.450–4.500)

## 2021-04-21 NOTE — Progress Notes (Signed)
Mildly low vitamin d and mild elevation of LDL cholesterol. Will discuss in detail with patient 05/28/2021.

## 2021-04-23 DIAGNOSIS — Z7689 Persons encountering health services in other specified circumstances: Secondary | ICD-10-CM | POA: Insufficient documentation

## 2021-04-23 DIAGNOSIS — J029 Acute pharyngitis, unspecified: Secondary | ICD-10-CM | POA: Insufficient documentation

## 2021-06-13 ENCOUNTER — Encounter: Payer: Managed Care, Other (non HMO) | Admitting: Nurse Practitioner

## 2021-06-13 ENCOUNTER — Telehealth: Payer: Self-pay | Admitting: Nurse Practitioner

## 2021-06-13 NOTE — Telephone Encounter (Signed)
Called pt she is aware of her lab results and recommendation and advised to take Vit d over the counter medication and return for next appt.

## 2021-06-13 NOTE — Telephone Encounter (Signed)
Patient called office stating that she got a call reminder that she had an appointment scheduled for today and states that she never made that appointment and doesn't know what its for. I looked back at the last office note and advised patient per AVS she was to schedule an appointment for CPE. This appointment was scheduled that day at check out with Toniann Fail.   I advised patient that I would have to no-show the appointment bc she has not given a 24 hour notice. Patient states she called Friday before 5pm to cancel but we wouldn't answer the phone. I advised patient our office closes on Fridays at 12N. She said she didn't know that and how was she supposed to know. I advised her the hours of operation are on the door, on the website and on the phone when calling after business hours. Patient states she never scheduled an appointment and thinks this is a scam.   She also said that she had lab work done in July and never heard from [place] with the results. I advised patient per note on labs made by provider that you were planning to discuss this with her today at her follow up appointment.   Patient is upset bc she feels we have not followed thru with lab results and that we are scheduling appointments for her without her consent.   I apologized to patient that she was upset and assured her that was not the case and advised her I would send a note to the provider to advise. AS, CMA

## 2021-06-13 NOTE — Telephone Encounter (Signed)
Regarding her labs, her LDL (bad cholesterol) was mildly elevated. I suggest avoiding fried and high fat foods and incorporating exercise into her daily routine. Her vitamin d was mildly low. She should take OTC vitamin d 5000 iu daily. All other labs were normal. Will recheck this next year.  On a side note, her labs were done in beginning of august and her appointment was initially scheduled in early September. My lab note states exactly what I said above and that we would discuss at visit 05/28/2021. I check dates of appointments before I put a note like that in system.

## 2023-10-25 ENCOUNTER — Ambulatory Visit: Payer: Managed Care, Other (non HMO) | Admitting: Family Medicine
